# Patient Record
Sex: Male | Born: 1975
Health system: Southern US, Community
[De-identification: ages and names within clinical notes are randomized; demographics above are authoritative.]

## PROBLEM LIST (undated history)

## (undated) DIAGNOSIS — G43909 Migraine, unspecified, not intractable, without status migrainosus: Secondary | ICD-10-CM

## (undated) DIAGNOSIS — IMO0001 Reserved for inherently not codable concepts without codable children: Secondary | ICD-10-CM

## (undated) DIAGNOSIS — G4700 Insomnia, unspecified: Secondary | ICD-10-CM

## (undated) DIAGNOSIS — R51 Headache: Principal | ICD-10-CM

## (undated) HISTORY — DX: Reserved for inherently not codable concepts without codable children: IMO0001

## (undated) HISTORY — DX: Migraine, unspecified, not intractable, without status migrainosus: G43.909

## (undated) HISTORY — DX: Insomnia, unspecified: G47.00

## (undated) HISTORY — DX: Headache: R51

---

## 2005-06-28 ENCOUNTER — Emergency Department (HOSPITAL_COMMUNITY): Admission: EM | Admit: 2005-06-28 | Discharge: 2005-06-28 | Payer: Self-pay | Admitting: Emergency Medicine

## 2011-02-27 ENCOUNTER — Emergency Department (HOSPITAL_COMMUNITY)
Admission: EM | Admit: 2011-02-27 | Discharge: 2011-02-28 | Disposition: A | Discharge status: Home / Self Care | Payer: Managed Care, Other (non HMO) | Attending: Emergency Medicine | Admitting: Emergency Medicine

## 2011-02-27 DIAGNOSIS — R109 Unspecified abdominal pain: Secondary | ICD-10-CM | POA: Insufficient documentation

## 2011-02-27 DIAGNOSIS — R10819 Abdominal tenderness, unspecified site: Secondary | ICD-10-CM | POA: Insufficient documentation

## 2011-02-27 DIAGNOSIS — Z9889 Other specified postprocedural states: Secondary | ICD-10-CM | POA: Insufficient documentation

## 2011-02-28 ENCOUNTER — Emergency Department (HOSPITAL_COMMUNITY): Payer: Managed Care, Other (non HMO)

## 2011-02-28 LAB — URINALYSIS, ROUTINE W REFLEX MICROSCOPIC
Bilirubin Urine: NEGATIVE
Glucose, UA: NEGATIVE mg/dL
Hgb urine dipstick: NEGATIVE
Leukocytes, UA: NEGATIVE
Nitrite: NEGATIVE
Specific Gravity, Urine: 1.036 — ABNORMAL HIGH (ref 1.005–1.030)
Urobilinogen, UA: 1 mg/dL (ref 0.0–1.0)

## 2011-02-28 LAB — CBC
HCT: 43 % (ref 39.0–52.0)
MCHC: 37.2 g/dL — ABNORMAL HIGH (ref 30.0–36.0)
MCV: 86.9 fL (ref 78.0–100.0)
Platelets: 345 10*3/uL (ref 150–400)
RDW: 11.9 % (ref 11.5–15.5)
WBC: 9.9 10*3/uL (ref 4.0–10.5)

## 2011-02-28 LAB — POCT I-STAT, CHEM 8
BUN: 20 mg/dL (ref 6–23)
Creatinine, Ser: 1.5 mg/dL — ABNORMAL HIGH (ref 0.50–1.35)
Hemoglobin: 16.3 g/dL (ref 13.0–17.0)
Potassium: 3.9 mEq/L (ref 3.5–5.1)
Sodium: 141 mEq/L (ref 135–145)
TCO2: 26 mmol/L (ref 0–100)

## 2011-02-28 LAB — DIFFERENTIAL
Eosinophils Absolute: 0 10*3/uL (ref 0.0–0.7)
Lymphocytes Relative: 24 % (ref 12–46)
Lymphs Abs: 2.4 10*3/uL (ref 0.7–4.0)
Monocytes Relative: 9 % (ref 3–12)

## 2011-02-28 LAB — HEPATIC FUNCTION PANEL
Bilirubin, Direct: 0.1 mg/dL (ref 0.0–0.3)
Indirect Bilirubin: 0.7 mg/dL (ref 0.3–0.9)

## 2011-02-28 MED ORDER — IOHEXOL 300 MG/ML  SOLN
80.0000 mL | Freq: Once | INTRAMUSCULAR | Status: AC | PRN
  Administered 2011-02-28: 80 mL via INTRAVENOUS

## 2012-10-16 IMAGING — CT CT ABD-PELV W/ CM
3 of 4 series · 14 of 32 positions shown, 19 images · IV contrast (80ml omni 300)
Comparison: 06/28/2005

CLINICAL DATA: Right-sided abdominal pain.

CT ABDOMEN AND PELVIS WITH CONTRAST
TECHNIQUE: Multidetector CT imaging of the abdomen and pelvis was
performed following the standard protocol during bolus
administration of intravenous contrast.
Contrast: 80mL OMNIPAQUE IOHEXOL 300 MG/ML IV SOLN

[Series 2: routine abdomen · axial · 0.78mm/px · z∈[-457,-167]mm · 4 of 96 slices shown, 9 images]
[im 20/96  soft-tissue]
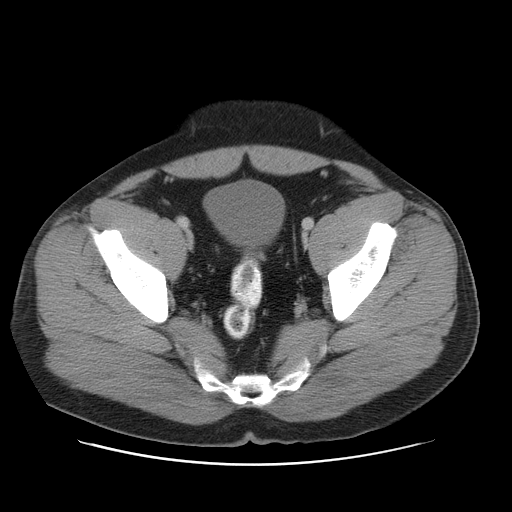
[im 20/96  lung]
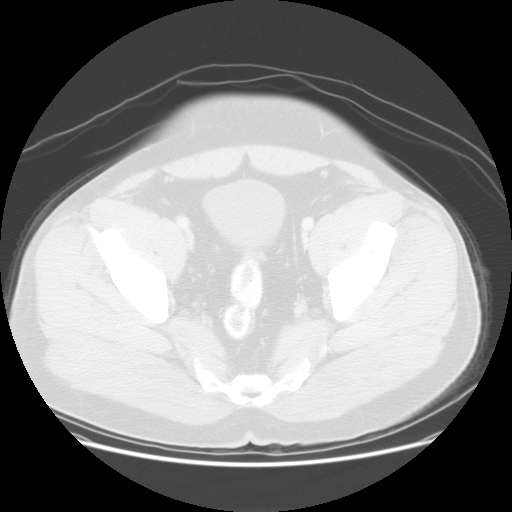
[im 20/96  bone]
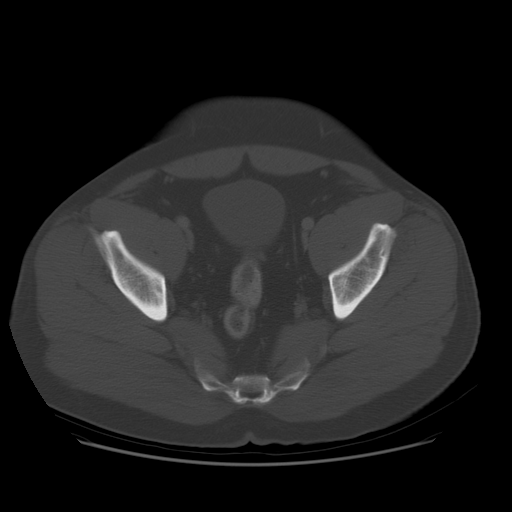
[im 39/96  soft-tissue]
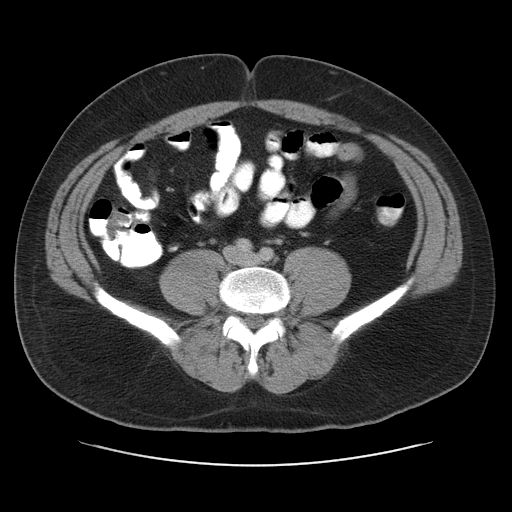
[im 39/96  lung]
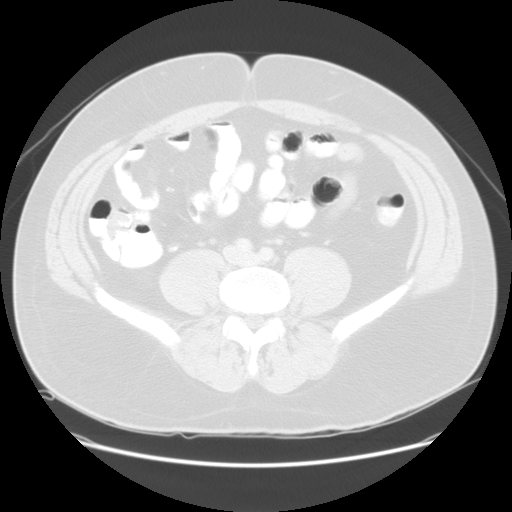
[im 58/96  soft-tissue]
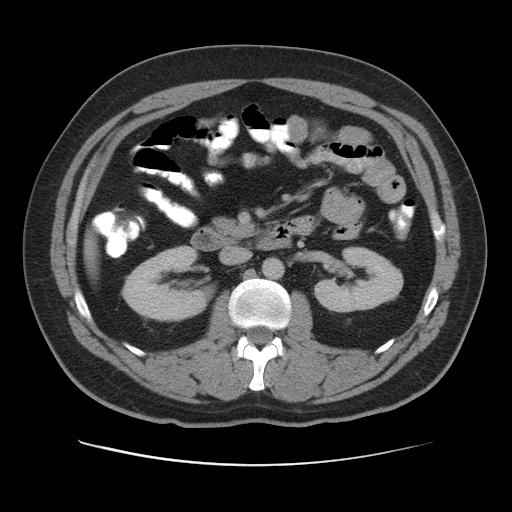
[im 58/96  lung]
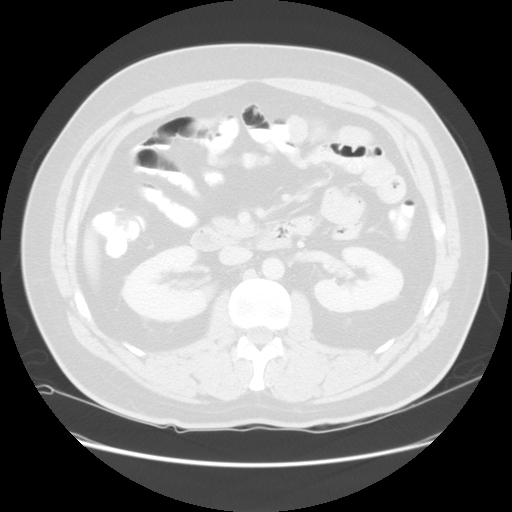
[im 77/96  soft-tissue]
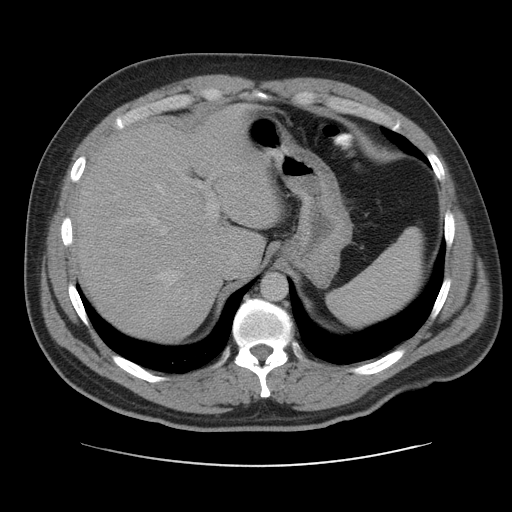
[im 77/96  lung]
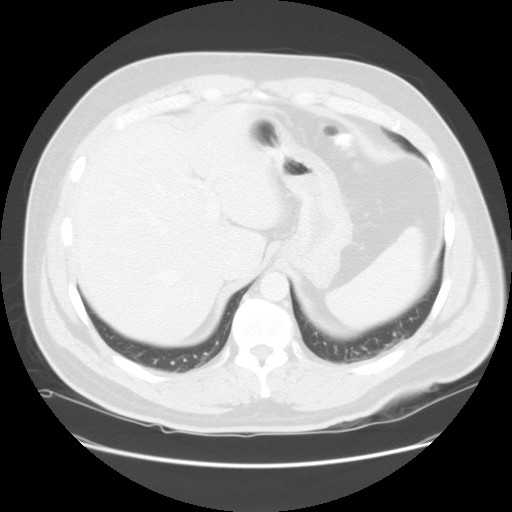

[Series 400: cor · coronal · 0.96mm/px · 2 of 161 slices shown]
[im 17/161  soft-tissue]
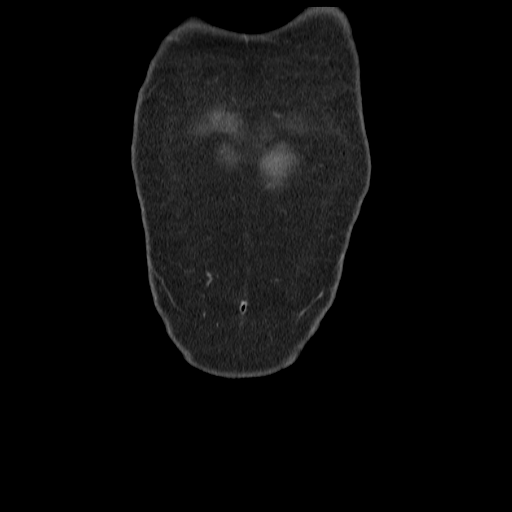
[im 33/161  soft-tissue]
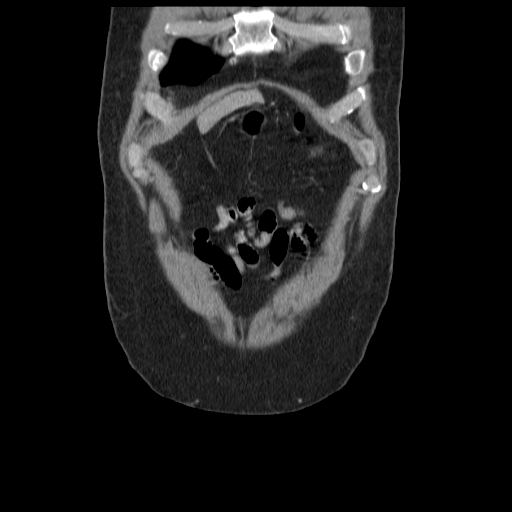

[Series 401: sag · sagittal · 0.96mm/px · 8 of 191 slices shown]
[im 16/191  soft-tissue]
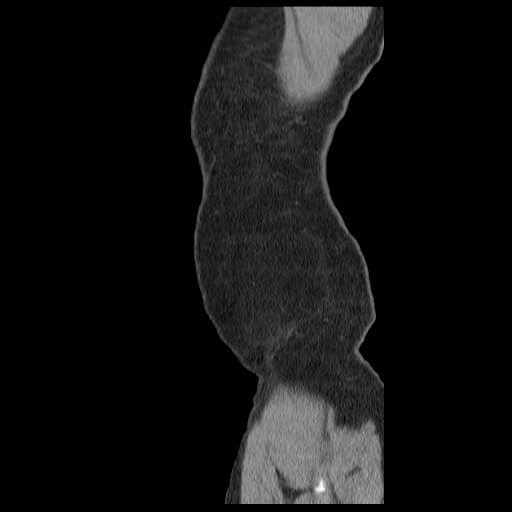
[im 48/191  soft-tissue]
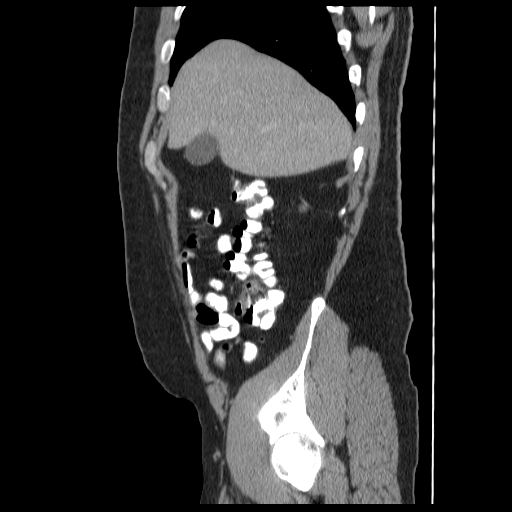
[im 64/191  soft-tissue]
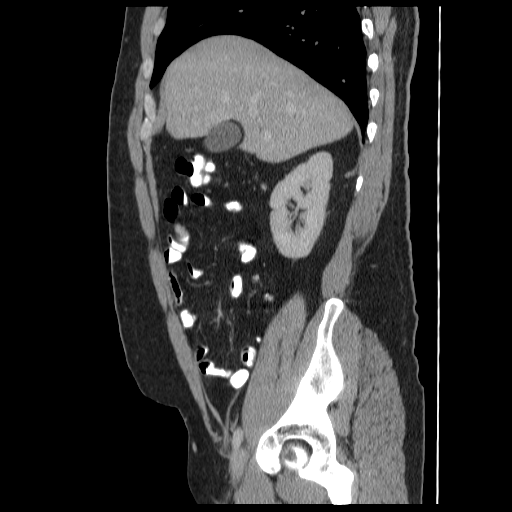
[im 80/191  soft-tissue]
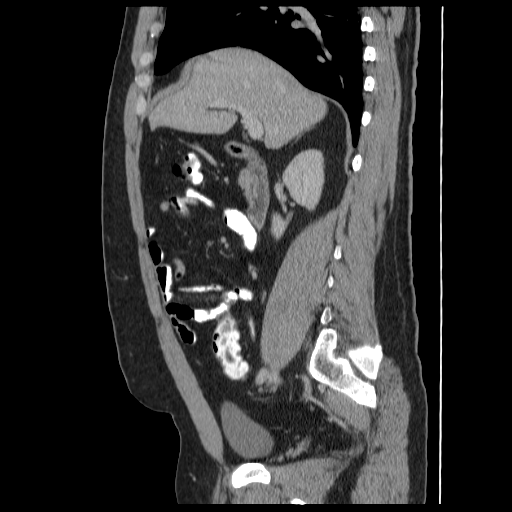
[im 111/191  soft-tissue]
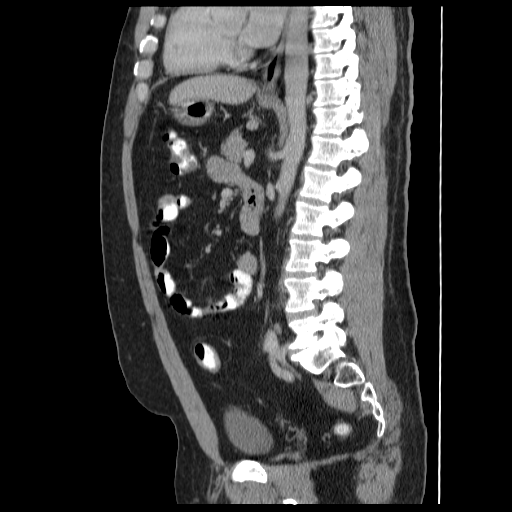
[im 127/191  soft-tissue]
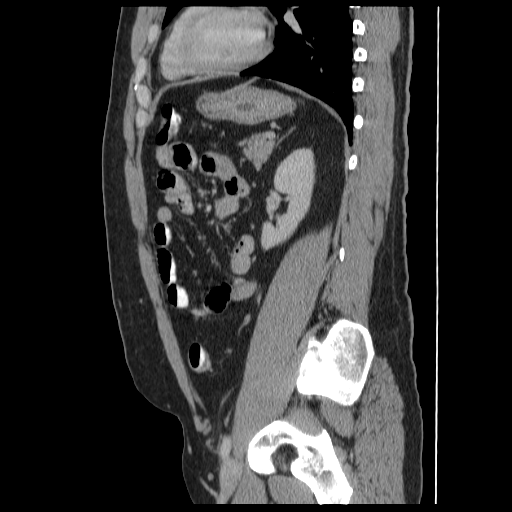
[im 143/191  soft-tissue]
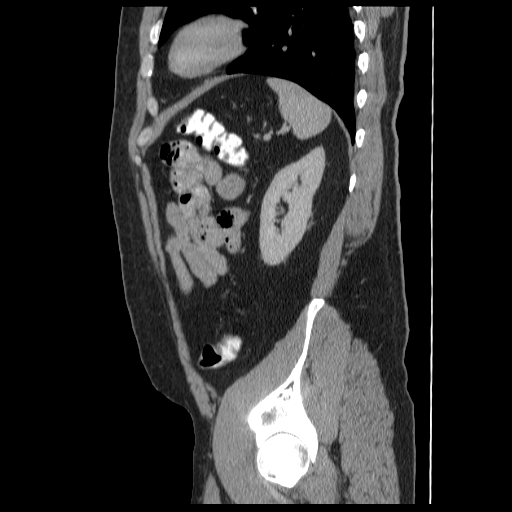
[im 175/191  soft-tissue]
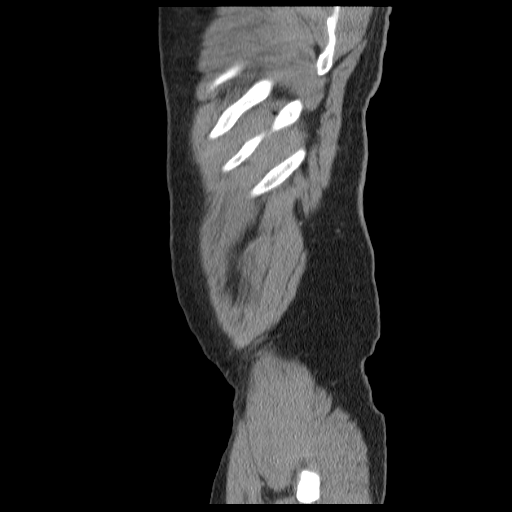

[14 of 32 positions shown; findings below may reference images not displayed]

FINDINGS: Limited images through the lung bases demonstrate no
significant appreciable abnormality. The heart size is within
normal limits. No pleural or pericardial effusion.

Low attenuation of the liver suggests fatty infiltration.
Unremarkable biliary system, spleen, pancreas, adrenal glands.
Bilateral nonobstructing renal stones.  No hydronephrosis or
hydroureter.

No bowel obstruction.  No CT evidence for colitis.  Normal
appendix.  Normal appearing terminal ileum.  No free
intraperitoneal air or fluid.  No lymphadenopathy.

There is scattered atherosclerotic calcification of the aorta and
its branches. No aneurysmal dilatation.

Thin-walled bladder.  Prostatic calcifications.

L5 S1 degenerative disc disease.  No aggressive osseous
abnormality.
IMPRESSION: Bilateral nonobstructing renal stones.  No hydronephrosis or
hydroureter.

Normal appendix.

## 2013-04-21 ENCOUNTER — Encounter: Payer: Self-pay | Admitting: Neurology

## 2013-04-22 ENCOUNTER — Encounter: Payer: Self-pay | Admitting: Neurology

## 2013-04-22 ENCOUNTER — Encounter (INDEPENDENT_AMBULATORY_CARE_PROVIDER_SITE_OTHER): Payer: Self-pay

## 2013-04-22 ENCOUNTER — Ambulatory Visit (INDEPENDENT_AMBULATORY_CARE_PROVIDER_SITE_OTHER): Payer: BC Managed Care – PPO | Admitting: Neurology

## 2013-04-22 VITALS — BP 131/91 | HR 72 | Temp 97.9°F | Ht 72.0 in | Wt 232.0 lb

## 2013-04-22 DIAGNOSIS — R0683 Snoring: Secondary | ICD-10-CM

## 2013-04-22 DIAGNOSIS — R0609 Other forms of dyspnea: Secondary | ICD-10-CM

## 2013-04-22 DIAGNOSIS — R51 Headache: Secondary | ICD-10-CM

## 2013-04-22 DIAGNOSIS — R519 Headache, unspecified: Secondary | ICD-10-CM

## 2013-04-22 DIAGNOSIS — I10 Essential (primary) hypertension: Secondary | ICD-10-CM

## 2013-04-22 HISTORY — DX: Headache, unspecified: R51.9

## 2013-04-22 MED ORDER — PROPRANOLOL HCL 20 MG PO TABS: ORAL_TABLET | ORAL | Status: DC

## 2013-04-22 NOTE — Patient Instructions (Addendum)
Please remember, common headache triggers are: sleep deprivation, dehydration, overheating, stress, hypoglycemia or skipping meals and blood sugar fluctuations, excessive pain medications or excessive alcohol use or caffeine withdrawal. Some people have food triggers such as aged cheese, orange juice or chocolate, especially dark chocolate, or MSG (monosodium glutamate). Try to avoid these headache triggers as much possible. It may be helpful to keep a headache diary to figure out what makes your headaches worse or brings them on and what alleviates them. Some people report headache onset after exercise but studies have shown that regular exercise may actually prevent headaches from coming. If you have exercise-induced headaches, please make sure that you drink plenty of fluid before and after exercising and that you do not over do it and do not overheat.  I think overall you are doing fairly well but I do want to suggest a few things today:  Remember to drink plenty of fluid, eat healthy meals and do not skip any meals. Try to eat protein with a every meal and eat a healthy snack such as fruit or nuts in between meals. Try to keep a regular sleep-wake schedule and try to exercise daily, particularly in the form of walking, 20-30 minutes a day, if you can.   As far as your medications are concerned, I would like to suggest a trial of Inderal (generic name: propranolol) 20 mg strength: take 1 pill each bedtime for 1 week, then 1 pill twice daily for 2 weeks, then 1 pill 3 times a day thereafter. Common side effect reported are: lethargy, sedation, low blood pressure and low pulse rate. Please monitor your BP and Pulse every few days, if your pulse drops lower than 55 you may feel bad and we may have to adjust your dose. Same with your BP below 110/55.   As far as diagnostic testing: sleep study and MRI brain wo contrast.  I would like to see you back in 3 months, sooner if we need to. Please call us with  any interim questions, concerns, problems, updates or refill requests.  Please also call us for any test results so we can go over those with you on the phone. Brett Canales is my clinical assistant and will answer any of your questions and relay your messages to me and also relay most of my messages to you.  Our phone number is [phone]. We also have an after hours call service for urgent matters and there is a physician on-call for urgent questions. For any emergencies you know to call 911 or go to the nearest emergency room.

## 2013-04-22 NOTE — Progress Notes (Addendum)
Subjective:    Patient ID: Dean Horn is a 37 y.o. male.  HPI    Huston Foley, MD, PhD [facility] [address] [address] [place], [place] [address]  Dear Dr. Hyacinth Meeker,  I saw your patient, Dean Horn, upon your kind request in my neurologic clinic today for initial consultation of his headache, in particular concern for migraines. The patient is accompanied by his wife today. As you know, Dean Horn is a very pleasant 37 year old right-handed gentleman with an otherwise benign medical history, who has had recurrent headaches for years. His headaches are described as a throbbing sensation around his eyes and in the back of his head and moderate in degree. They do occur every 3 days approximately. He has had associated nausea and sometimes vomiting, photophobia and sensitivity to noise. He has noted stress and exercise his triggers and relieving factors include going to sleep in a dark room. He has seen you and you placed him on amitriptyline and gave him samples of Zomig. Zomig has been helpful. He takes about 7 pills each month. He took one pill of amitriptyline but did not like how he felt on it so he never took more.  He reports a bilateral headache, which is described as constant ache, and stabbing in addition to throbbing. There is no associated nausea and vomiting, and there is no photophobia and sonophobia. He denies unilateral nasal congestion or tearing of his eye. Sometimes his eyes get red because he rubs them. Of note, he snores and sometimes this is loud. He is a light sleeper and restless sleeper. He often wakes up with a headache according to his wife. He describes the headache often that starts in the back of his head and his neck area and radiates up. It is rarely unilateral only. He has had issues with kidney stones. He had a bump in his creatinine level a couple of years ago because he was passing a kidney stone. He was told that he had 2 or 3  more small stones that do not need further attention with any procedure. He was told that he should avoid certain medications. He is not sure what his blood pressure is like typically. He does not go to the doctor very often. He has made some choking sounds while asleep according to his wife. He denies frank daytime somnolence. He works about 10 hours a day and works out 1 hour first thing in the morning. There is no family history of migraine headaches and there is no family history of stroke or sleep apnea. Never had TIA or stroke symptoms, denying sudden onset of one sided weakness, numbness, tingling, slurring of speech or droopy face, hearing loss, tinnitus, diplopia or visual field cut or monocular loss of vision.  His Past Medical History Is Significant For: Past Medical History  Diagnosis Date  . Migraine, unspecified, without mention of intractable migraine without mention of status migrainosus   . Insomnia, unspecified   . Myalgia and myositis, unspecified   . Recurrent headache 04/22/2013    His Past Surgical History Is Significant For: History reviewed. No pertinent past surgical history.  His Family History Is Significant For: Family History  Problem Relation Age of Onset  . Diabetes Mother   . Hypertension Mother     His Social History Is Significant For: History   Social History  . Marital Status: Married    Spouse Name: N/A    Number of Children: N/A  .  Years of Education: N/A   Social History Main Topics  . Smoking status: Never Smoker   . Smokeless tobacco: None  . Alcohol Use: No  . Drug Use: No  . Sexual Activity: None   Other Topics Concern  . None   Social History Narrative  . None    His Allergies Are:  No Known Allergies:   His Current Medications Are:  Outpatient Encounter Prescriptions as of 04/22/2013  Medication Sig  . zolmitriptan (ZOMIG-ZMT) 5 MG disintegrating tablet Take 1 tablet by mouth as needed.  Marland Kitchen LORazepam (ATIVAN) 2 MG tablet  Take 1 tablet by mouth as needed.  . propranolol (INDERAL) 20 MG tablet 1 pill each bedtime for 1 week, then 1 pill twice daily for 2 weeks, then 1 pill 3 times a day thereafter.  . [DISCONTINUED] amitriptyline (ELAVIL) 25 MG tablet Take 25 mg by mouth at bedtime.  :  Review of Systems:  Out of a complete 14 point review of systems, all are reviewed and negative with the exception of these symptoms as listed below:  Review of Systems  Constitutional: Negative.   Eyes: Negative.   Respiratory: Negative.   Cardiovascular: Negative.   Gastrointestinal: Negative.   Endocrine: Negative.   Genitourinary: Negative.   Musculoskeletal: Negative.   Skin: Negative.   Allergic/Immunologic: Negative.   Neurological: Positive for headaches (migraine).  Hematological: Negative.   Psychiatric/Behavioral: Positive for sleep disturbance.    Objective:  Neurologic Exam  Physical Exam Physical Examination:   Filed Vitals:   04/22/13 0818  BP: 131/91  Pulse: 72  Temp: 97.9 F (36.6 C)   General Examination: The patient is a very pleasant 37 y.o. male in no acute distress. He appears well-developed and well-nourished and adequately groomed.   HEENT: Normocephalic, atraumatic, pupils are equal, round and reactive to light and accommodation. Funduscopic exam is normal with sharp disc margins noted. Extraocular tracking is good without limitation to gaze excursion or nystagmus noted. Normal smooth pursuit is noted. Hearing is grossly intact. Tympanic membranes are clear bilaterally. Face is symmetric with normal facial animation and normal facial sensation. Speech is clear with no dysarthria noted. There is no hypophonia. There is no lip, neck/head, jaw or voice tremor. Neck is supple with full range of passive and active motion. There are no carotid bruits on auscultation. Oropharynx exam reveals: mild mouth dryness, adequate dental hygiene and mild airway crowding, due to redundant soft palate and  mildly elongated uvula. Mallampati is class I. Tongue protrudes centrally and palate elevates symmetrically. Tonsils are 1+ in size/absent. Neck size is 17 inches.   Chest: Clear to auscultation without wheezing, rhonchi or crackles noted.  Heart: S1+S2+0, regular and normal without murmurs, rubs or gallops noted.   Abdomen: Soft, non-tender and non-distended with normal bowel sounds appreciated on auscultation.  Extremities: There is no pitting edema in the distal lower extremities bilaterally. Pedal pulses are intact.  Skin: Warm and dry without trophic changes noted. There are no varicose veins.  Musculoskeletal: exam reveals no obvious joint deformities, tenderness or joint swelling or erythema.   Neurologically:  Mental status: The patient is awake, alert and oriented in all 4 spheres. His memory, attention, language and knowledge are appropriate. There is no aphasia, agnosia, apraxia or anomia. Speech is clear with normal prosody and enunciation. Thought process is linear. Mood is congruent and affect is normal.  Cranial nerves are as described above under HEENT exam. In addition, shoulder shrug is normal with equal shoulder height noted.  Motor exam: Normal bulk, strength and tone is noted in UEs and LEs. There is no drift, tremor or rebound. Romberg is negative. Reflexes are 2+ throughout. Toes are downgoing bilaterally. Fine motor skills are intact with normal finger taps, normal hand movements, normal rapid alternating patting, normal foot taps and normal foot agility.  Cerebellar testing shows no dysmetria or intention tremor on finger to nose testing. Heel to shin is unremarkable bilaterally. There is no truncal or gait ataxia.  Sensory exam is intact to light touch, pinprick, vibration, temperature sense and proprioception in the upper and lower extremities.  Gait, station and balance are unremarkable. No veering to one side is noted. No leaning to one side is noted. Posture is  age-appropriate and stance is narrow based. No problems turning are noted. He turns en bloc. Tandem walk is unremarkable. Intact toe and heel stance is noted.               Assessment and Plan:   In summary, Dean Horn is a very pleasant 37 y.o.-year old male with an underlying fairly benign medical history, who presents with recurrent HAs. His history and physical exam are not quite typical for migraine headaches even now he does improve with stomach. His history does not suggest cluster headaches. He does have some elevated blood pressure value today and is not sure if he has normal or high blood pressure values at baseline. He may have a combination headache including tension type headache, some migrainous headache, cervicogenic headache and headache do to underlying sleep apnea. His history and physical exam are somewhat concerning for obstructive sleep apnea as he does snore, often wakes up with a headache, and has a mildly enlarged next size. He also is overweight. I talked to him and his wife at length about his symptoms, my findings and his history in detail today. I suggested that we proceed with further testing in the form of brain MRI and sleep study. I reassured him that his exam is nonfocal. For symptomatic treatment I suggested that he continue with Zomig as needed. We talked about prophylactic treatment. Given his kidney stone history of would avoid Topamax in his case. He did not like amitriptyline. I will avoid gabapentin at this time as he wants to not take anything that makes him too sleepy. He has a little baby and wants to be able to wake up to tend to her. I suggested a trial of Inderal starting with 20 mg once daily with weekly increments will increase to up to 20 mg 3 times a day. I talked to him about side effects including drop in blood pressure, lightheadedness, lethargy, drop in pulse rate. I have advised him to keep a blood pressure log. I would like to see him back after his MRI  and his sleep study are completed. We will also call him with the test results to keep them updated. He is advised to call with any interim questions, concerns, problems or updates or refill requests. I will routinely see him back in 3 months, sooner if the need arises. I answered all his questions today and he and his wife were in agreement. I talked to him about common headache triggers: sleep deprivation, dehydration, overheating, stress, hypoglycemia or skipping meals and blood sugar fluctuations, excessive pain medications or excessive alcohol use or caffeine withdrawal. Some people have food triggers such as aged cheese, orange juice or chocolate, especially dark chocolate, or MSG (monosodium glutamate). He is to try to avoid  these headache triggers as much possible. It may be helpful to keep a headache diary to figure out what makes His headaches worse or brings them on and what alleviates them. Some people report headache onset after exercise but studies have shown that regular exercise may actually prevent headaches from coming. If He has exercise-induced headaches, He is advised to drink plenty of fluid before and after exercising and that to not overdo it and to not overheat. Thank you very much for allowing me to participate in the care of this nice patient. If I can be of any further assistance to you please do not hesitate to call me at [phone].  Sincerely,   Huston Foley, MD, PhD

## 2013-04-30 ENCOUNTER — Telehealth: Payer: Self-pay | Admitting: Neurology

## 2013-04-30 MED ORDER — ZOLMITRIPTAN 5 MG PO TBDP: 5.0000 mg | ORAL_TABLET | ORAL | Status: DC | PRN

## 2013-04-30 NOTE — Telephone Encounter (Signed)
Patient's [facility] pharmacy called stating that they haven't heard back about patient's Zomig refill request.

## 2013-04-30 NOTE — Telephone Encounter (Signed)
This was the first request we have received for this medication.  Rx has been sent.

## 2013-05-27 ENCOUNTER — Ambulatory Visit (INDEPENDENT_AMBULATORY_CARE_PROVIDER_SITE_OTHER): Payer: BC Managed Care – PPO

## 2013-05-27 DIAGNOSIS — R519 Headache, unspecified: Secondary | ICD-10-CM

## 2013-05-27 DIAGNOSIS — R51 Headache: Secondary | ICD-10-CM

## 2013-05-27 DIAGNOSIS — R0683 Snoring: Secondary | ICD-10-CM

## 2013-05-27 DIAGNOSIS — I10 Essential (primary) hypertension: Secondary | ICD-10-CM

## 2013-05-31 ENCOUNTER — Telehealth: Payer: Self-pay | Admitting: Neurology

## 2013-05-31 NOTE — Progress Notes (Signed)
Quick Note:  Please advise patient that his brain MRI without contrast showed normal findings as far as the brain structure and fluid spaces are concerned. However, there are findings consistent with chronic and severe sinus disease. While there is no acute sinusitis. He may have chronic sinus issues that may contribute to headaches. If he has not seen an ear nose throat physician. It may be wise to see an ENT doctor. Please advise patient and asked him if he wants a referral for ENT.  Huston FoleySaima Melvine Julin, MD, PhD [facility] ([facility]  ______

## 2013-05-31 NOTE — Telephone Encounter (Signed)
Patient's wife calling requesting results of MRI - Can also leave msg on wife's phone [phone] regarding results

## 2013-06-02 NOTE — Telephone Encounter (Signed)
Message copied by [facility], Zarian Colpitts on Wed Jun 02, 2013 10:55 AM ------      Message from: Huston FoleyATHAR, SAIMA      Created: Mon May 31, 2013  8:57 AM       Please advise patient that his brain MRI without contrast showed normal findings as far as the brain structure and fluid spaces are concerned. However, there are findings consistent with chronic and severe sinus disease. While there is no acute sinusitis. He may have chronic sinus issues that may contribute to headaches. If he has not seen an ear nose throat physician. It may be wise to see an ENT doctor. Please advise patient and asked him if he wants a referral for ENT.       Huston FoleySaima Athar, MD, PhD      [facility]       ------

## 2013-06-02 NOTE — Telephone Encounter (Signed)
I called and left VM at the below indicated phone number. I reviewed findings and asked that we receive a return call if an ENT specialist referral is needed.

## 2013-06-07 ENCOUNTER — Emergency Department (HOSPITAL_BASED_OUTPATIENT_CLINIC_OR_DEPARTMENT_OTHER): Payer: BC Managed Care – PPO

## 2013-06-07 ENCOUNTER — Encounter (HOSPITAL_BASED_OUTPATIENT_CLINIC_OR_DEPARTMENT_OTHER): Payer: Self-pay | Admitting: Emergency Medicine

## 2013-06-07 ENCOUNTER — Emergency Department (HOSPITAL_BASED_OUTPATIENT_CLINIC_OR_DEPARTMENT_OTHER)
Admission: EM | Admit: 2013-06-07 | Discharge: 2013-06-08 | Disposition: A | Discharge status: Home / Self Care | Payer: BC Managed Care – PPO | Attending: Emergency Medicine | Admitting: Emergency Medicine

## 2013-06-07 DIAGNOSIS — Z79899 Other long term (current) drug therapy: Secondary | ICD-10-CM | POA: Insufficient documentation

## 2013-06-07 DIAGNOSIS — N201 Calculus of ureter: Secondary | ICD-10-CM | POA: Insufficient documentation

## 2013-06-07 DIAGNOSIS — G4700 Insomnia, unspecified: Secondary | ICD-10-CM | POA: Insufficient documentation

## 2013-06-07 DIAGNOSIS — G43909 Migraine, unspecified, not intractable, without status migrainosus: Secondary | ICD-10-CM | POA: Insufficient documentation

## 2013-06-07 DIAGNOSIS — N23 Unspecified renal colic: Secondary | ICD-10-CM

## 2013-06-07 LAB — URINE MICROSCOPIC-ADD ON

## 2013-06-07 LAB — URINALYSIS, ROUTINE W REFLEX MICROSCOPIC
BILIRUBIN URINE: NEGATIVE
GLUCOSE, UA: NEGATIVE mg/dL
KETONES UR: NEGATIVE mg/dL
Leukocytes, UA: NEGATIVE
Nitrite: NEGATIVE
PROTEIN: 30 mg/dL — AB
Specific Gravity, Urine: 1.037 — ABNORMAL HIGH (ref 1.005–1.030)
UROBILINOGEN UA: 0.2 mg/dL (ref 0.0–1.0)
pH: 6 (ref 5.0–8.0)

## 2013-06-07 MED ORDER — TAMSULOSIN HCL 0.4 MG PO CAPS
0.4000 mg | ORAL_CAPSULE | Freq: Once | ORAL | Status: AC
  Administered 2013-06-07: 0.4 mg via ORAL
  Filled 2013-06-07: qty 1

## 2013-06-07 MED ORDER — KETOROLAC TROMETHAMINE 30 MG/ML IJ SOLN
30.0000 mg | Freq: Once | INTRAMUSCULAR | Status: AC
  Administered 2013-06-07: 30 mg via INTRAVENOUS
  Filled 2013-06-07: qty 1

## 2013-06-07 MED ORDER — SODIUM CHLORIDE 0.9 % IV SOLN
INTRAVENOUS | Status: DC
Start: 2013-06-07 — End: 2013-06-08
  Administered 2013-06-07: via INTRAVENOUS

## 2013-06-07 NOTE — ED Notes (Addendum)
Back pain. Hematuria yesterday. States he feels like he has a kidney stone. Hx of stone 7 years ago. He took a left over Vicodin for pain this am.

## 2013-06-07 NOTE — ED Provider Notes (Signed)
CSN: [id number]     Arrival date & time 06/07/13  2007 History  This chart was scribed for Hanley Seamen, MD by Dorothey Baseman, ED Scribe. This patient was seen in room MH02/MH02 and the patient's care was started at 10:47 PM.    Chief Complaint  Patient presents with  . Back Pain   The history is provided by the patient. No language interpreter was used.   HPI Comments: Dean Horn is a 38 y.o. male with a history of kidney stones who presents to the Emergency Department complaining of an intermittent pain to the right flank onset 1.5 days ago that he states feels similar to his past kidney stones. He reports some associated hematuria, nausea, vomiting, and chills onset yesterday. He reports taking hydrocodone at home with mild, temporary relief of his pain. Patient has no other pertinent medical history.   Past Medical History  Diagnosis Date  . Migraine, unspecified, without mention of intractable migraine without mention of status migrainosus   . Insomnia, unspecified   . Myalgia and myositis, unspecified   . Recurrent headache 04/22/2013   History reviewed. No pertinent past surgical history. Family History  Problem Relation Age of Onset  . Diabetes Mother   . Hypertension Mother    History  Substance Use Topics  . Smoking status: Never Smoker   . Smokeless tobacco: Not on file  . Alcohol Use: No    Review of Systems  A complete 10 system review of systems was obtained and all systems are negative except as noted in the HPI and PMH.   Allergies  Review of patient's allergies indicates no known allergies.  Home Medications   Current Outpatient Rx  Name  Route  Sig  Dispense  Refill  . LORazepam (ATIVAN) 2 MG tablet   Oral   Take 1 tablet by mouth as needed.         . propranolol (INDERAL) 20 MG tablet      1 pill each bedtime for 1 week, then 1 pill twice daily for 2 weeks, then 1 pill 3 times a day thereafter.   90 tablet   5   . zolmitriptan (ZOMIG-ZMT) 5 MG  disintegrating tablet   Oral   Take 1 tablet (5 mg total) by mouth as needed.   8 tablet   3    Triage Vitals: BP 124/93  Pulse 87  Temp(Src) 98.9 F (37.2 C) (Oral)  Resp 20  Ht 6' (1.829 m)  Wt 232 lb (105.235 kg)  BMI 31.46 kg/m2  SpO2 100%  Physical Exam  Nursing note and vitals reviewed. Constitutional: He is oriented to person, place, and time. He appears well-developed and well-nourished. No distress.  HENT:  Head: Normocephalic and atraumatic.  Eyes: Conjunctivae and EOM are normal. Pupils are equal, round, and reactive to light.  Neck: Normal range of motion. Neck supple.  Cardiovascular: Normal rate, regular rhythm and normal heart sounds.   Pulmonary/Chest: Effort normal and breath sounds normal. No respiratory distress.  Abdominal: He exhibits no distension.  Genitourinary:  Mild right CVA tenderness.   Musculoskeletal: Normal range of motion.  Neurological: He is alert and oriented to person, place, and time.  Skin: Skin is warm and dry.  Psychiatric: He has a normal mood and affect. His behavior is normal.    ED Course  Procedures (including critical care time)  DIAGNOSTIC STUDIES: Oxygen Saturation is 100% on room air, normal by my interpretation.    COORDINATION OF CARE:  10:50 PM- Discussed that UA is positive for hematuria. Will order a CT of the abdomen. Will order IV fluids and Toradol to manage symptoms. Discussed treatment plan with patient at bedside and patient verbalized agreement.   11:37 PM- Discussed that CT results indicate a large kidney stone. Will discharge patient with Flomax. Advised patient to follow up with the referred urologist. Discussed treatment plan with patient at bedside and patient verbalized agreement.     Results for orders placed during the hospital encounter of 06/07/13  URINALYSIS, ROUTINE W REFLEX MICROSCOPIC      Result Value Range   Color, Urine YELLOW  YELLOW   APPearance CLEAR  CLEAR   Specific Gravity, Urine  1.037 (*) 1.005 - 1.030   pH 6.0  5.0 - 8.0   Glucose, UA NEGATIVE  NEGATIVE mg/dL   Hgb urine dipstick MODERATE (*) NEGATIVE   Bilirubin Urine NEGATIVE  NEGATIVE   Ketones, ur NEGATIVE  NEGATIVE mg/dL   Protein, ur 30 (*) NEGATIVE mg/dL   Urobilinogen, UA 0.2  0.0 - 1.0 mg/dL   Nitrite NEGATIVE  NEGATIVE   Leukocytes, UA NEGATIVE  NEGATIVE  URINE MICROSCOPIC-ADD ON      Result Value Range   Squamous Epithelial / LPF RARE  RARE   RBC / HPF 7-10  <3 RBC/hpf   Bacteria, UA RARE  RARE   Ct Abdomen Pelvis Wo Contrast  06/08/2013   CLINICAL DATA:  Right-sided flank pain with nausea and vomiting for 2 days. Chills and hematuria.  EXAM: CT ABDOMEN AND PELVIS WITHOUT CONTRAST  TECHNIQUE: Multidetector CT imaging of the abdomen and pelvis was performed following the standard protocol without intravenous contrast.  COMPARISON:  DG ABDOMEN 1V dated 04/17/2011; CT ABD/PELVIS W CM dated 02/28/2011  FINDINGS: Lower Chest: Clear lung bases. Normal heart size without pericardial or pleural effusion.  Abdomen/Pelvis: Mild hepatic steatosis. Normal infused appearance of the spleen, stomach, pancreas, gallbladder, biliary tract, adrenal glands.  Bilateral nephrolithiasis. Moderate right-sided hydroureteronephrosis which continues to the level of a right bladder base stone which measures 6 mm on image 91/series 2. Likely just exiting the ureterovesicular junction.  Mild but age advanced aortic atherosclerosis. No retroperitoneal or retrocrural adenopathy. Normal colon, appendix, and terminal ileum. Normal small bowel without abdominal ascites.  No pelvic adenopathy. Normal prostate, without significant free pelvic fluid.  Bones/Musculoskeletal: Degenerative disc disease at the lumbosacral junction.  IMPRESSION: 1. Moderate right-sided urinary tract obstruction secondary to a stone at or just exiting the right ureterovesicular junction. 2. Bilateral nephrolithiasis. 3. Mild hepatic steatosis. 4. Age advanced  atherosclerosis.   Electronically Signed   By: Jeronimo Greaves M.D.   On: 06/08/2013 00:16            MDM   Nursing notes and vitals signs, including pulse oximetry, reviewed.  Summary of this visit's results, reviewed by myself:  Labs:  Results for orders placed during the hospital encounter of 06/07/13 (from the past 24 hour(s))  URINALYSIS, ROUTINE W REFLEX MICROSCOPIC     Status: Abnormal   Collection Time    06/07/13  8:30 PM      Result Value Range   Color, Urine YELLOW  YELLOW   APPearance CLEAR  CLEAR   Specific Gravity, Urine 1.037 (*) 1.005 - 1.030   pH 6.0  5.0 - 8.0   Glucose, UA NEGATIVE  NEGATIVE mg/dL   Hgb urine dipstick MODERATE (*) NEGATIVE   Bilirubin Urine NEGATIVE  NEGATIVE   Ketones, ur NEGATIVE  NEGATIVE mg/dL  Protein, ur 30 (*) NEGATIVE mg/dL   Urobilinogen, UA 0.2  0.0 - 1.0 mg/dL   Nitrite NEGATIVE  NEGATIVE   Leukocytes, UA NEGATIVE  NEGATIVE  URINE MICROSCOPIC-ADD ON     Status: None   Collection Time    06/07/13  8:30 PM      Result Value Range   Squamous Epithelial / LPF RARE  RARE   RBC / HPF 7-10  <3 RBC/hpf   Bacteria, UA RARE  RARE    Imaging Studies: Ct Abdomen Pelvis Wo Contrast  06/08/2013   CLINICAL DATA:  Right-sided flank pain with nausea and vomiting for 2 days. Chills and hematuria.  EXAM: CT ABDOMEN AND PELVIS WITHOUT CONTRAST  TECHNIQUE: Multidetector CT imaging of the abdomen and pelvis was performed following the standard protocol without intravenous contrast.  COMPARISON:  DG ABDOMEN 1V dated 04/17/2011; CT ABD/PELVIS W CM dated 02/28/2011  FINDINGS: Lower Chest: Clear lung bases. Normal heart size without pericardial or pleural effusion.  Abdomen/Pelvis: Mild hepatic steatosis. Normal infused appearance of the spleen, stomach, pancreas, gallbladder, biliary tract, adrenal glands.  Bilateral nephrolithiasis. Moderate right-sided hydroureteronephrosis which continues to the level of a right bladder base stone which measures 6  mm on image 91/series 2. Likely just exiting the ureterovesicular junction.  Mild but age advanced aortic atherosclerosis. No retroperitoneal or retrocrural adenopathy. Normal colon, appendix, and terminal ileum. Normal small bowel without abdominal ascites.  No pelvic adenopathy. Normal prostate, without significant free pelvic fluid.  Bones/Musculoskeletal: Degenerative disc disease at the lumbosacral junction.  IMPRESSION: 1. Moderate right-sided urinary tract obstruction secondary to a stone at or just exiting the right ureterovesicular junction. 2. Bilateral nephrolithiasis. 3. Mild hepatic steatosis. 4. Age advanced atherosclerosis.   Electronically Signed   By: Jeronimo GreavesKyle  Talbot M.D.   On: 06/08/2013 00:16   12:24 AM Pain controlled with IV Toradol. Patient advised of CT findings. Stone may be ready to pass is large enough that it may require urologic assistance.   I personally performed the services described in this documentation, which was scribed in my presence.  The recorded information has been reviewed and is accurate.     Hanley SeamenJohn L Crosby Bevan, MD 06/08/13 [phone]

## 2013-06-08 ENCOUNTER — Other Ambulatory Visit: Payer: Self-pay | Admitting: Urology

## 2013-06-08 MED ORDER — HYDROMORPHONE HCL 4 MG PO TABS: 2.0000 mg | ORAL_TABLET | ORAL | Status: DC | PRN

## 2013-06-08 MED ORDER — FENTANYL CITRATE 0.05 MG/ML IJ SOLN
100.0000 ug | Freq: Once | INTRAMUSCULAR | Status: AC
  Administered 2013-06-08: 100 ug via INTRAVENOUS
  Filled 2013-06-08: qty 2

## 2013-06-08 MED ORDER — TAMSULOSIN HCL 0.4 MG PO CAPS: ORAL_CAPSULE | ORAL | Status: DC

## 2013-06-08 NOTE — Discharge Instructions (Signed)
Kidney Stones  Kidney stones (urolithiasis) are deposits that form inside your kidneys. The intense pain is caused by the stone moving through the urinary tract. When the stone moves, the ureter goes into spasm around the stone. The stone is usually passed in the urine.   CAUSES   · A disorder that makes certain neck glands produce too much parathyroid hormone (primary hyperparathyroidism).  · A buildup of uric acid crystals, similar to gout in your joints.  · Narrowing (stricture) of the ureter.  · A kidney obstruction present at birth (congenital obstruction).  · Previous surgery on the kidney or ureters.  · Numerous kidney infections.  SYMPTOMS   · Feeling sick to your stomach (nauseous).  · Throwing up (vomiting).  · Blood in the urine (hematuria).  · Pain that usually spreads (radiates) to the groin.  · Frequency or urgency of urination.  DIAGNOSIS   · Taking a history and physical exam.  · Blood or urine tests.  · CT scan.  · Occasionally, an examination of the inside of the urinary bladder (cystoscopy) is performed.  TREATMENT   · Observation.  · Increasing your fluid intake.  · Extracorporeal shock wave lithotripsy This is a noninvasive procedure that uses shock waves to break up kidney stones.  · Surgery may be needed if you have severe pain or persistent obstruction. There are various surgical procedures. Most of the procedures are performed with the use of small instruments. Only small incisions are needed to accommodate these instruments, so recovery time is minimized.  The size, location, and chemical composition are all important variables that will determine the proper choice of action for you. Talk to your health care provider to better understand your situation so that you will minimize the risk of injury to yourself and your kidney.   HOME CARE INSTRUCTIONS   · Drink enough water and fluids to keep your urine clear or pale yellow. This will help you to pass the stone or stone fragments.  · Strain  all urine through the provided strainer. Keep all particulate matter and stones for your health care provider to see. The stone causing the pain may be as small as a grain of salt. It is very important to use the strainer each and every time you pass your urine. The collection of your stone will allow your health care provider to analyze it and verify that a stone has actually passed. The stone analysis will often identify what you can do to reduce the incidence of recurrences.  · Only take over-the-counter or prescription medicines for pain, discomfort, or fever as directed by your health care provider.  · Make a follow-up appointment with your health care provider as directed.  · Get follow-up X-rays if required. The absence of pain does not always mean that the stone has passed. It may have only stopped moving. If the urine remains completely obstructed, it can cause loss of kidney function or even complete destruction of the kidney. It is your responsibility to make sure X-rays and follow-ups are completed. Ultrasounds of the kidney can show blockages and the status of the kidney. Ultrasounds are not associated with any radiation and can be performed easily in a matter of minutes.  SEEK MEDICAL CARE IF:  · You experience pain that is progressive and unresponsive to any pain medicine you have been prescribed.  SEEK IMMEDIATE MEDICAL CARE IF:   · Pain cannot be controlled with the prescribed medicine.  · You have a fever   or shaking chills.  · The severity or intensity of pain increases over 18 hours and is not relieved by pain medicine.  · You develop a new onset of abdominal pain.  · You feel faint or pass out.  · You are unable to urinate.  MAKE SURE YOU:   · Understand these instructions.  · Will watch your condition.  · Will get help right away if you are not doing well or get worse.  Document Released: 05/06/2005 Document Revised: 01/06/2013 Document Reviewed: 10/07/2012  [employer]® Patient Information ©2014  [employer].

## 2013-06-17 ENCOUNTER — Ambulatory Visit (HOSPITAL_COMMUNITY): Admission: RE | Admit: 2013-06-17 | Payer: BC Managed Care – PPO | Source: Ambulatory Visit | Admitting: Urology

## 2013-06-17 ENCOUNTER — Encounter (HOSPITAL_COMMUNITY): Admission: RE | Payer: Self-pay | Source: Ambulatory Visit

## 2013-06-17 SURGERY — LITHOTRIPSY, ESWL
Anesthesia: LOCAL | Laterality: Right

## 2013-09-02 ENCOUNTER — Ambulatory Visit: Payer: BC Managed Care – PPO | Admitting: Neurology

## 2013-12-16 ENCOUNTER — Other Ambulatory Visit: Payer: Self-pay | Admitting: Neurology

## 2013-12-16 ENCOUNTER — Telehealth: Payer: Self-pay | Admitting: Neurology

## 2013-12-16 NOTE — Telephone Encounter (Signed)
Spouse calling to check on ENT Referral.  Please return call anytime and leave message if not available.  Thanks

## 2013-12-16 NOTE — Telephone Encounter (Signed)
Called and left VM message for return call concerning ENT referral, patient has not been seen since 04/2013, see telephone note from 05/2013, which ENT referral was suggested by Dr Frances FurbishAthar

## 2013-12-20 NOTE — Telephone Encounter (Signed)
Spoke with patient's wife and she said that she will do a little more research , will call back  with ENT physician she would like to have  the referral sent

## 2014-08-09 ENCOUNTER — Telehealth: Payer: Self-pay | Admitting: Neurology

## 2014-08-09 NOTE — Telephone Encounter (Signed)
Left message for wife. Stated that we last saw Dean Horn in 04/2013 and we would not be able to make that referral for him. I suggested that he get referral from his PCP. I left call back number for any questions.

## 2014-08-09 NOTE — Telephone Encounter (Signed)
Patient's [facility].  Please call and advise.

## 2014-09-16 ENCOUNTER — Other Ambulatory Visit: Payer: Self-pay | Admitting: Neurology

## 2014-09-16 NOTE — Telephone Encounter (Signed)
Last seen in 2014 

## 2014-12-01 DIAGNOSIS — Z87442 Personal history of urinary calculi: Secondary | ICD-10-CM | POA: Insufficient documentation

## 2014-12-01 DIAGNOSIS — G43009 Migraine without aura, not intractable, without status migrainosus: Secondary | ICD-10-CM | POA: Insufficient documentation

## 2015-03-30 DIAGNOSIS — N433 Hydrocele, unspecified: Secondary | ICD-10-CM | POA: Insufficient documentation

## 2017-03-06 ENCOUNTER — Ambulatory Visit: Payer: Managed Care, Other (non HMO) | Admitting: Family Medicine

## 2017-03-13 ENCOUNTER — Ambulatory Visit (INDEPENDENT_AMBULATORY_CARE_PROVIDER_SITE_OTHER): Payer: BLUE CROSS/BLUE SHIELD | Admitting: Family Medicine

## 2017-03-13 ENCOUNTER — Encounter: Payer: Self-pay | Admitting: Family Medicine

## 2017-03-13 DIAGNOSIS — G43009 Migraine without aura, not intractable, without status migrainosus: Secondary | ICD-10-CM

## 2017-03-13 NOTE — Progress Notes (Signed)
New patient office visit note:  Impression and Recommendations:    1. Migraine without aura and without status migrainosus, not intractable      There are no diagnoses linked to this encounter.  No problem-specific Assessment & Plan notes found for this encounter.   The patient was counseled, risk factors were discussed, anticipatory guidance given.   New Prescriptions   No medications on file    No orders of the defined types were placed in this encounter.   Discontinued Medications   HYDROMORPHONE (DILAUDID) 4 MG TABLET    Take 0.5-1 tablets (2-4 mg total) by mouth every 4 (four) hours as needed (for pain).   LORAZEPAM (ATIVAN) 2 MG TABLET    Take 1 tablet by mouth as needed.   PROPRANOLOL (INDERAL) 20 MG TABLET    1 pill each bedtime for 1 week, then 1 pill twice daily for 2 weeks, then 1 pill 3 times a day thereafter.   TAMSULOSIN (FLOMAX) 0.4 MG CAPS CAPSULE    Take 1 capsule daily until stone passes.    Modified Medications   No medications on file    No orders of the defined types were placed in this encounter.    Gross side effects, risk and benefits, and alternatives of medications discussed with patient.  Patient is aware that all medications have potential side effects and we are unable to predict every side effect or drug-drug interaction that may occur.  Expresses verbal understanding and consents to current therapy plan and treatment regimen.  Return for Fasting bldwrk-near future;then OV w me 1 wk later.  Please see AVS handed out to patient at the end of our visit for further patient instructions/ counseling done pertaining to today's office visit.    Note: This document was prepared using Dragon voice recognition software and may include unintentional dictation errors.  ----------------------------------------------------------------------------------------------------------------------    Subjective:    Chief complaint:   Chief  Complaint  Patient presents with  . Establish Care     HPI: Dean Horn is a pleasant 41 y.o. male who presents to [facility] today to review their medical history with me and establish care.   I asked the patient to review their chronic problem list with me to ensure everything was updated and accurate.    All recent office visits with other providers, any medical records that patient brought in etc  - I reviewed today.     Also asked pt to get me medical records from [facility] providers/ specialists that they had seen within the past 3-5 years- if they are in private practice and/or do not work for a [employer], [facility], [facility], [facility] or [employer] owned practice.  Told them to call their specialists to clarify this if they are not sure.   Sula Rumple in [place]- [place]prior PCP but patient recently moved out here.  Last lipid panel was done 12 12/14/2015 with total cholesterol 171, triglyceride 188, HDL 32, VLDL 38, LDL 101.  TSH 1.78, serum creatinine 1.01, glucose 89 sodium 140 potassium 4.7 AST 29 ALT 40 alkaline phosphatase 75 total bilirubin 0.7.   Problem  Migraine Without Aura and Without Status Migrainosus, Not Intractable   Having to use Zomig - once every three months.        Wt Readings from Last 3 Encounters:  03/13/17 231 lb 11.2 oz (105.1 kg)  06/07/13 232 lb (105.2 kg)  04/22/13 232 lb (105.2 kg)  BP Readings from Last 3 Encounters:  03/13/17 120/87  06/07/13 124/93  04/22/13 (!) 131/91   Pulse Readings from Last 3 Encounters:  03/13/17 100  06/07/13 87  04/22/13 72   BMI Readings from Last 3 Encounters:  03/13/17 31.42 kg/m  06/07/13 31.46 kg/m  04/22/13 31.46 kg/m    Patient Care Team    Relationship Specialty Notifications Start End  Patient, No Pcp Per PCP - General General Practice  06/07/13     Patient Active Problem List   Diagnosis Date Noted  . Left hydrocele 03/30/2015  . History of  nephrolithiasis 12/01/2014  . Migraine without aura and without status migrainosus, not intractable 12/01/2014  . Recurrent headache 04/22/2013     Past Medical History:  Diagnosis Date  . Insomnia, unspecified   . Migraine, unspecified, without mention of intractable migraine without mention of status migrainosus   . Myalgia and myositis, unspecified   . Recurrent headache 04/22/2013     Past Medical History:  Diagnosis Date  . Insomnia, unspecified   . Migraine, unspecified, without mention of intractable migraine without mention of status migrainosus   . Myalgia and myositis, unspecified   . Recurrent headache 04/22/2013     History reviewed. No pertinent surgical history.   Family History  Problem Relation Age of Onset  . Diabetes Mother   . Hypertension Mother      History  Drug Use No     History  Alcohol Use No     History  Smoking Status  . Never Smoker  Smokeless Tobacco  . Never Used     Outpatient Encounter Prescriptions as of 03/13/2017  Medication Sig Note  . zolmitriptan (ZOMIG-ZMT) 5 MG disintegrating tablet DISSOLVE ONE TABLET BY MOUTH AS NEEDED   . [DISCONTINUED] HYDROmorphone (DILAUDID) 4 MG tablet Take 0.5-1 tablets (2-4 mg total) by mouth every 4 (four) hours as needed (for pain).   . [DISCONTINUED] LORazepam (ATIVAN) 2 MG tablet Take 1 tablet by mouth as needed. 04/22/2013: Received from: External Pharmacy Received Sig:   . [DISCONTINUED] propranolol (INDERAL) 20 MG tablet 1 pill each bedtime for 1 week, then 1 pill twice daily for 2 weeks, then 1 pill 3 times a day thereafter.   . [DISCONTINUED] tamsulosin (FLOMAX) 0.4 MG CAPS capsule Take 1 capsule daily until stone passes.    No facility-administered encounter medications on file as of 03/13/2017.     Allergies: Patient has no known allergies.   ROS   Objective:   Blood pressure 120/87, pulse 100, height 6' (1.829 m), weight 231 lb 11.2 oz (105.1 kg). Body mass index is  31.42 kg/m. General: Well Developed, well nourished, and in no acute distress.  Neuro: Alert and oriented x3, extra-ocular muscles intact, sensation grossly intact.  HEENT:[place]/AT, PERRLA, neck supple, No carotid bruits Skin: no gross rashes  Cardiac: Regular rate and rhythm Respiratory: Essentially clear to auscultation bilaterally. Not using accessory muscles, speaking in full sentences.  Abdominal: not grossly distended Musculoskeletal: Ambulates w/o diff, FROM * 4 ext.  Vasc: less 2 sec cap RF, warm and pink  Psych:  No HI/SI, judgement and insight good, Euthymic mood. Full Affect.    No results found for this or any previous visit (from the past 2160 hour(s)).

## 2017-03-13 NOTE — Patient Instructions (Addendum)
Please make a follow-up appointment at your convenience in the near future to come in for just fasting blood work.  We draw the blood right here.  Then since your new patient will come in about a week or 2 later to discuss the results with me.     Please realize, EXERCISE IS MEDICINE!  -  American Heart Association [facility]( AHA) guidelines for exercise : If you are in good health, without any medical conditions, you should engage in 150 minutes of moderate intensity aerobic activity per week.  This means you should be huffing and puffing throughout your workout.   Engaging in regular exercise will improve brain function and memory, as well as improve mood, boost immune system and help with weight management.  As well as the other, more well-known effects of exercise such as decreasing blood sugar levels, decreasing blood pressure,  and decreasing bad cholesterol levels/ increasing good cholesterol levels.     -  The AHA strongly endorses consumption of a diet that contains a variety of foods from all the food categories with an emphasis on fruits and vegetables; fat-free and low-fat dairy products; cereal and grain products; legumes and nuts; and fish, poultry, and/or extra lean meats.    Excessive food intake, especially of foods high in saturated and trans fats, sugar, and salt, should be avoided.    Adequate water intake of roughly 1/2 of your weight in pounds, should equal the ounces of water per day you should drink.  So for instance, if you're 200 pounds, that would be 100 ounces of water per day.         Mediterranean Diet  Why follow it? Research shows. . Those who follow the Mediterranean diet have a reduced risk of heart disease  . The diet is associated with a reduced incidence of Parkinson's and Alzheimer's diseases . People following the diet may have longer life expectancies and lower rates of chronic diseases  . The Dietary Guidelines for Americans recommends the Mediterranean diet as an  eating plan to promote health and prevent disease  What Is the Mediterranean Diet?  . Healthy eating plan based on typical foods and recipes of Mediterranean-style cooking . The diet is primarily a plant based diet; these foods should make up a majority of meals   Starches - Plant based foods should make up a majority of meals - They are an important sources of vitamins, minerals, energy, antioxidants, and fiber - Choose whole grains, foods high in fiber and minimally processed items  - Typical grain sources include wheat, oats, barley, corn, brown rice, bulgar, farro, millet, polenta, couscous  - Various types of beans include chickpeas, lentils, fava beans, black beans, white beans   Fruits  Veggies - Large quantities of antioxidant rich fruits & veggies; 6 or more servings  - Vegetables can be eaten raw or lightly drizzled with oil and cooked  - Vegetables common to the traditional Mediterranean Diet include: artichokes, arugula, beets, broccoli, brussel sprouts, cabbage, carrots, celery, collard greens, cucumbers, eggplant, kale, leeks, lemons, lettuce, mushrooms, okra, onions, peas, peppers, potatoes, pumpkin, radishes, rutabaga, shallots, spinach, sweet potatoes, turnips, zucchini - Fruits common to the Mediterranean Diet include: apples, apricots, avocados, cherries, clementines, dates, figs, grapefruits, grapes, melons, nectarines, oranges, peaches, pears, pomegranates, strawberries, tangerines  Fats - Replace butter and margarine with healthy oils, such as olive oil, canola oil, and tahini  - Limit nuts to no more than a handful a day  - Nuts include walnuts, almonds,  pecans, pistachios, pine nuts  - Limit or avoid candied, honey roasted or heavily salted nuts - Olives are central to the Mediterranean diet - can be eaten whole or used in a variety of dishes   Meats Protein - Limiting red meat: no more than a few times a month - When eating red meat: choose lean cuts and keep the portion  to the size of deck of cards - Eggs: approx. 0 to 4 times a week  - Fish and lean poultry: at least 2 a week  - Healthy protein sources include, chicken, [place], lean beef, lamb - Increase intake of seafood such as tuna, salmon, trout, mackerel, shrimp, scallops - Avoid or limit high fat processed meats such as sausage and bacon  Dairy - Include moderate amounts of low fat dairy products  - Focus on healthy dairy such as fat free yogurt, skim milk, low or reduced fat cheese - Limit dairy products higher in fat such as whole or 2% milk, cheese, ice cream  Alcohol - Moderate amounts of red wine is ok  - No more than 5 oz daily for women (all ages) and men older than age 64  - No more than 10 oz of wine daily for men younger than 81  Other - Limit sweets and other desserts  - Use herbs and spices instead of salt to flavor foods  - Herbs and spices common to the traditional Mediterranean Diet include: basil, bay leaves, chives, cloves, cumin, fennel, garlic, lavender, marjoram, mint, oregano, parsley, pepper, rosemary, sage, savory, sumac, tarragon, thyme   It's not just a diet, it's a lifestyle:  . The Mediterranean diet includes lifestyle factors typical of those in the region  . Foods, drinks and meals are best eaten with others and savored . Daily physical activity is important for overall good health . This could be strenuous exercise like running and aerobics . This could also be more leisurely activities such as walking, housework, yard-work, or taking the stairs . Moderation is the key; a balanced and healthy diet accommodates most foods and drinks . Consider portion sizes and frequency of consumption of certain foods   Meal Ideas & Options:  . Breakfast:  o Whole wheat toast or whole wheat English muffins with peanut butter & hard boiled egg o Steel cut oats topped with apples & cinnamon and skim milk  o Fresh fruit: banana, strawberries, melon, berries, peaches  o Smoothies:  strawberries, bananas, greek yogurt, peanut butter o Low fat greek yogurt with blueberries and granola  o Egg white omelet with spinach and mushrooms o Breakfast couscous: whole wheat couscous, apricots, skim milk, cranberries  . Sandwiches:  o Hummus and grilled vegetables (peppers, zucchini, squash) on whole wheat bread   o Grilled chicken on whole wheat pita with lettuce, tomatoes, cucumbers or tzatziki  o Tuna salad on whole wheat bread: tuna salad made with greek yogurt, olives, red peppers, capers, green onions o Garlic rosemary lamb pita: lamb sauted with garlic, rosemary, salt & pepper; add lettuce, cucumber, greek yogurt to pita - flavor with lemon juice and black pepper  . Seafood:  o Mediterranean grilled salmon, seasoned with garlic, basil, parsley, lemon juice and black pepper o Shrimp, lemon, and spinach whole-grain pasta salad made with low fat greek yogurt  o Seared scallops with lemon orzo  o Seared tuna steaks seasoned salt, pepper, coriander topped with tomato mixture of olives, tomatoes, olive oil, minced garlic, parsley, green onions and cappers  . Meats:  o Herbed greek chicken salad with kalamata olives, cucumber, feta  o Red bell peppers stuffed with spinach, bulgur, lean ground beef (or lentils) & topped with feta   o Kebabs: skewers of chicken, tomatoes, onions, zucchini, squash  o [place] burgers: made with red onions, mint, dill, lemon juice, feta cheese topped with roasted red peppers . Vegetarian o Cucumber salad: cucumbers, artichoke hearts, celery, red onion, feta cheese, tossed in olive oil & lemon juice  o Hummus and whole grain pita points with a greek salad (lettuce, tomato, feta, olives, cucumbers, red onion) o Lentil soup with celery, carrots made with vegetable broth, garlic, salt and pepper  o Tabouli salad: parsley, bulgur, mint, scallions, cucumbers, tomato, radishes, lemon juice, olive oil, salt and pepper.

## 2017-03-20 ENCOUNTER — Other Ambulatory Visit (INDEPENDENT_AMBULATORY_CARE_PROVIDER_SITE_OTHER): Payer: BLUE CROSS/BLUE SHIELD

## 2017-03-20 DIAGNOSIS — G43009 Migraine without aura, not intractable, without status migrainosus: Secondary | ICD-10-CM

## 2017-03-20 DIAGNOSIS — R51 Headache: Secondary | ICD-10-CM

## 2017-03-20 DIAGNOSIS — N433 Hydrocele, unspecified: Secondary | ICD-10-CM

## 2017-03-20 DIAGNOSIS — Z0000 Encounter for general adult medical examination without abnormal findings: Secondary | ICD-10-CM

## 2017-03-20 DIAGNOSIS — Z87442 Personal history of urinary calculi: Secondary | ICD-10-CM

## 2017-03-20 DIAGNOSIS — R519 Headache, unspecified: Secondary | ICD-10-CM

## 2017-03-20 NOTE — Addendum Note (Signed)
Addended by: Leda MinPULLIAM, Adalin Vanderploeg D on: 03/20/2017 09:18 AM   Modules accepted: Orders

## 2017-03-21 LAB — CBC WITH DIFFERENTIAL/PLATELET
BASOS ABS: 0 10*3/uL (ref 0.0–0.2)
Basos: 0 %
EOS (ABSOLUTE): 0.1 10*3/uL (ref 0.0–0.4)
Eos: 2 %
HEMATOCRIT: 46.2 % (ref 37.5–51.0)
Hemoglobin: 16.1 g/dL (ref 13.0–17.7)
IMMATURE GRANULOCYTES: 0 %
Immature Grans (Abs): 0 10*3/uL (ref 0.0–0.1)
LYMPHS ABS: 1.7 10*3/uL (ref 0.7–3.1)
Lymphs: 36 %
MCH: 31.8 pg (ref 26.6–33.0)
MCHC: 34.8 g/dL (ref 31.5–35.7)
MCV: 91 fL (ref 79–97)
MONOS ABS: 0.4 10*3/uL (ref 0.1–0.9)
Monocytes: 9 %
NEUTROS PCT: 53 %
Neutrophils Absolute: 2.5 10*3/uL (ref 1.4–7.0)
PLATELETS: 313 10*3/uL (ref 150–379)
RBC: 5.07 x10E6/uL (ref 4.14–5.80)
RDW: 13.1 % (ref 12.3–15.4)
WBC: 4.7 10*3/uL (ref 3.4–10.8)

## 2017-03-21 LAB — COMPREHENSIVE METABOLIC PANEL
ALK PHOS: 70 IU/L (ref 39–117)
ALT: 29 IU/L (ref 0–44)
AST: 23 IU/L (ref 0–40)
Albumin/Globulin Ratio: 2.1 (ref 1.2–2.2)
Albumin: 4.6 g/dL (ref 3.5–5.5)
BUN/Creatinine Ratio: 16 (ref 9–20)
BUN: 17 mg/dL (ref 6–24)
Bilirubin Total: 0.7 mg/dL (ref 0.0–1.2)
CALCIUM: 9.2 mg/dL (ref 8.7–10.2)
CO2: 24 mmol/L (ref 20–29)
CREATININE: 1.04 mg/dL (ref 0.76–1.27)
Chloride: 103 mmol/L (ref 96–106)
GFR calc Af Amer: 102 mL/min/{1.73_m2} (ref >59)
GFR, EST NON AFRICAN AMERICAN: 88 mL/min/{1.73_m2} (ref >59)
GLOBULIN, TOTAL: 2.2 g/dL (ref 1.5–4.5)
Glucose: 107 mg/dL — ABNORMAL HIGH (ref 65–99)
POTASSIUM: 4.4 mmol/L (ref 3.5–5.2)
SODIUM: 140 mmol/L (ref 134–144)
Total Protein: 6.8 g/dL (ref 6.0–8.5)

## 2017-03-21 LAB — TSH: TSH: 1.67 u[IU]/mL (ref 0.450–4.500)

## 2017-03-21 LAB — HEMOGLOBIN A1C
Est. average glucose Bld gHb Est-mCnc: 100 mg/dL
HEMOGLOBIN A1C: 5.1 % (ref 4.8–5.6)

## 2017-03-21 LAB — VITAMIN D 25 HYDROXY (VIT D DEFICIENCY, FRACTURES): Vit D, 25-Hydroxy: 31.6 ng/mL (ref 30.0–100.0)

## 2017-03-21 LAB — LIPID PANEL
CHOL/HDL RATIO: 5.1 ratio — AB (ref 0.0–5.0)
CHOLESTEROL TOTAL: 152 mg/dL (ref 100–199)
HDL: 30 mg/dL — AB (ref >39)
LDL Calculated: 99 mg/dL (ref 0–99)
TRIGLYCERIDES: 116 mg/dL (ref 0–149)
VLDL Cholesterol Cal: 23 mg/dL (ref 5–40)

## 2017-03-27 ENCOUNTER — Ambulatory Visit: Payer: BLUE CROSS/BLUE SHIELD | Admitting: Family Medicine

## 2017-03-27 ENCOUNTER — Encounter: Payer: Self-pay | Admitting: Family Medicine

## 2017-03-27 ENCOUNTER — Other Ambulatory Visit: Payer: BLUE CROSS/BLUE SHIELD

## 2017-03-27 VITALS — BP 138/84 | HR 89 | Ht 72.0 in | Wt 228.8 lb

## 2017-03-27 DIAGNOSIS — Z23 Encounter for immunization: Secondary | ICD-10-CM

## 2017-03-27 DIAGNOSIS — R51 Headache: Secondary | ICD-10-CM

## 2017-03-27 DIAGNOSIS — R519 Headache, unspecified: Secondary | ICD-10-CM

## 2017-03-27 DIAGNOSIS — E559 Vitamin D deficiency, unspecified: Secondary | ICD-10-CM

## 2017-03-27 MED ORDER — VITAMIN D3 125 MCG (5000 UT) PO TABS: ORAL_TABLET | ORAL | 3 refills | Status: DC

## 2017-03-27 NOTE — Progress Notes (Signed)
Assessment and plan:  1. Vitamin D insufficiency   2. Recurrent headache     There are no diagnoses linked to this encounter.  There are no diagnoses linked to this encounter.  No problem-specific Assessment & Plan notes found for this encounter.    Education and routine counseling performed. Handouts provided.  No orders of the defined types were placed in this encounter.    Return for 6-6876mo.  Marland Kitchen.   Anticipatory guidance and routine counseling done re: condition, txmnt options and need for follow up. All questions of patient's were answered.   Gross side effects, risk and benefits, and alternatives of medications discussed with patient.  Patient is aware that all medications have potential side effects and we are unable to predict every sideeffect or drug-drug interaction that may occur.  Expresses verbal understanding and consents to current therapy plan and treatment regiment.  Please see AVS handed out to patient at the end of our visit for additional patient instructions/ counseling done pertaining to today's office visit.  Note: This document was prepared using Dragon voice recognition software and may include unintentional dictation errors.   ----------------------------------------------------------------------------------------------------------------------  Subjective:   CC:   Dean Horn is a 41 y.o. male who [facility] review and discussion of recent bloodwork that was done.  1. All recent blood work that we ordered was reviewed with patient today.  Patient was counseled on all abnormalities and we discussed dietary and lifestyle changes that could help those values (also medications when appropriate).  Extensive health counseling performed and all patient's concerns/ questions were addressed.     Wt Readings from Last 3 Encounters:    03/27/17 228 lb 12.8 oz (103.8 kg)  03/13/17 231 lb 11.2 oz (105.1 kg)  06/07/13 232 lb (105.2 kg)   BP Readings from Last 3 Encounters:  03/27/17 138/84  03/13/17 120/87  06/07/13 124/93   Pulse Readings from Last 3 Encounters:  03/27/17 89  03/13/17 100  06/07/13 87   BMI Readings from Last 3 Encounters:  03/27/17 31.03 kg/m  03/13/17 31.42 kg/m  06/07/13 31.46 kg/m     Patient Care Team    Relationship Specialty Notifications Start End  Thomasene Lotpalski, Yanil Dawe, DO PCP - General Family Medicine  03/14/17     Full medical history updated and reviewed in the office today  Patient Active Problem List   Diagnosis Date Noted  . Vitamin D insufficiency 03/27/2017  . Left hydrocele 03/30/2015  . History of nephrolithiasis 12/01/2014  . Migraine without aura and without status migrainosus, not intractable 12/01/2014  . Recurrent headache 04/22/2013    Past Medical History:  Diagnosis Date  . Insomnia, unspecified   . Migraine, unspecified, without mention of intractable migraine without mention of status migrainosus   . Myalgia and myositis, unspecified   . Recurrent headache 04/22/2013    History reviewed. No pertinent surgical history.  Social History   Tobacco Use  . Smoking status: Never Smoker  . Smokeless tobacco: Never Used  Substance Use Topics  . Alcohol use: No    Family Hx: Family History  Problem Relation Age of Onset  . Diabetes Mother   . Hypertension Mother      Medications: Current Outpatient Medications  Medication Sig Dispense Refill  . zolmitriptan (ZOMIG-ZMT) 5 MG disintegrating tablet DISSOLVE ONE TABLET BY MOUTH AS NEEDED 8 tablet 3  . Cholecalciferol (VITAMIN D3) 5000 units TABS 5,000  IU OTC vitamin D3 daily. 90 tablet 3   No current facility-administered medications for this visit.     Allergies:  No Known Allergies   Review of Systems: General:   No F/C, wt loss Pulm:   No DIB, SOB, pleuritic chest pain Card:  No CP,  palpitations Abd:  No n/v/d or pain Ext:  No inc edema from baseline  Objective:  Blood pressure 138/84, pulse 89, height 6' (1.829 m), weight 228 lb 12.8 oz (103.8 kg). Body mass index is 31.03 kg/m. Gen:   Well NAD, A and O *3 HEENT:    [place]/AT, EOMI,  MMM Lungs:   Normal work of breathing. CTA B/L, no Wh, rhonchi Heart:   RRR, S1, S2 WNL's, no MRG Abd:   No gross distention Exts:    warm, pink,  Brisk capillary refill, warm and well perfused.  Psych:    No HI/SI, judgement and insight good, Euthymic mood. Full Affect.   Recent Results (from the past 2160 hour(s))  CBC with Differential/Platelet     Status: None   Collection Time: 03/20/17  9:23 AM  Result Value Ref Range   WBC 4.7 3.4 - 10.8 x10E3/uL   RBC 5.07 4.14 - 5.80 x10E6/uL   Hemoglobin 16.1 13.0 - 17.7 g/dL   Hematocrit 16.146.2 09.637.5 - 51.0 %   MCV 91 79 - 97 fL   MCH 31.8 26.6 - 33.0 pg   MCHC 34.8 31.5 - 35.7 g/dL   RDW 04.513.1 40.912.3 - 81.115.4 %   Platelets 313 150 - 379 x10E3/uL   Neutrophils 53 Not Estab. %   Lymphs 36 Not Estab. %   Monocytes 9 Not Estab. %   Eos 2 Not Estab. %   Basos 0 Not Estab. %   Neutrophils Absolute 2.5 1.4 - 7.0 x10E3/uL   Lymphocytes Absolute 1.7 0.7 - 3.1 x10E3/uL   Monocytes Absolute 0.4 0.1 - 0.9 x10E3/uL   EOS (ABSOLUTE) 0.1 0.0 - 0.4 x10E3/uL   Basophils Absolute 0.0 0.0 - 0.2 x10E3/uL   Immature Granulocytes 0 Not Estab. %   Immature Grans (Abs) 0.0 0.0 - 0.1 x10E3/uL  Comprehensive metabolic panel     Status: Abnormal   Collection Time: 03/20/17  9:23 AM  Result Value Ref Range   Glucose 107 (H) 65 - 99 mg/dL   BUN 17 6 - 24 mg/dL   Creatinine, Ser 9.141.04 0.76 - 1.27 mg/dL   GFR calc non Af Amer 88 >59 mL/min/1.73   GFR calc Af Amer 102 >59 mL/min/1.73   BUN/Creatinine Ratio 16 9 - 20   Sodium 140 134 - 144 mmol/L   Potassium 4.4 3.5 - 5.2 mmol/L   Chloride 103 96 - 106 mmol/L   CO2 24 20 - 29 mmol/L   Calcium 9.2 8.7 - 10.2 mg/dL   Total Protein 6.8 6.0 - 8.5 g/dL   Albumin  4.6 3.5 - 5.5 g/dL   Globulin, Total 2.2 1.5 - 4.5 g/dL   Albumin/Globulin Ratio 2.1 1.2 - 2.2   Bilirubin Total 0.7 0.0 - 1.2 mg/dL   Alkaline Phosphatase 70 39 - 117 IU/L   AST 23 0 - 40 IU/L   ALT 29 0 - 44 IU/L  Hemoglobin A1c     Status: None   Collection Time: 03/20/17  9:23 AM  Result Value Ref Range   Hgb A1c MFr Bld 5.1 4.8 - 5.6 %    Comment:          Prediabetes: 5.7 -  6.4          Diabetes: >6.4          Glycemic control for adults with diabetes: <7.0    Est. average glucose Bld gHb Est-mCnc 100 mg/dL  Lipid panel     Status: Abnormal   Collection Time: 03/20/17  9:23 AM  Result Value Ref Range   Cholesterol, Total 152 100 - 199 mg/dL   Triglycerides 161 0 - 149 mg/dL   HDL 30 (L) >09 mg/dL   VLDL Cholesterol Cal 23 5 - 40 mg/dL   LDL Calculated 99 0 - 99 mg/dL   Chol/HDL Ratio 5.1 (H) 0.0 - 5.0 ratio    Comment:                                   T. Chol/HDL Ratio                                             Men  Women                               1/2 Avg.Risk  3.4    3.3                                   Avg.Risk  5.0    4.4                                2X Avg.Risk  9.6    7.1                                3X Avg.Risk 23.4   11.0   TSH     Status: None   Collection Time: 03/20/17  9:23 AM  Result Value Ref Range   TSH 1.670 0.450 - 4.500 uIU/mL  VITAMIN D 25 Hydroxy (Vit-D Deficiency, Fractures)     Status: None   Collection Time: 03/20/17  9:23 AM  Result Value Ref Range   Vit D, 25-Hydroxy 31.6 30.0 - 100.0 ng/mL    Comment: Vitamin D deficiency has been defined by the Institute of Medicine and an Endocrine Society practice guideline as a level of serum 25-OH vitamin D less than 20 ng/mL (1,2). The Endocrine Society went on to further define vitamin D insufficiency as a level between 21 and 29 ng/mL (2). 1. IOM (Institute of Medicine). 2010. Dietary reference    intakes for calcium and D. [place]: The    [employer]. 2. Holick MF,  Binkley Newberry, Bischoff-Ferrari HA, et al.    Evaluation, treatment, and prevention of vitamin D    deficiency: an Endocrine Society clinical practice    guideline. JCEM. 2011 Jul; 96(7):1911-30.

## 2017-03-27 NOTE — Patient Instructions (Signed)

## 2017-09-25 ENCOUNTER — Ambulatory Visit: Payer: BLUE CROSS/BLUE SHIELD | Admitting: Family Medicine

## 2017-10-02 ENCOUNTER — Ambulatory Visit: Payer: BLUE CROSS/BLUE SHIELD | Admitting: Family Medicine

## 2017-10-02 ENCOUNTER — Encounter: Payer: Self-pay | Admitting: Family Medicine

## 2017-10-02 VITALS — BP 130/94 | HR 92 | Ht 72.0 in | Wt 229.3 lb

## 2017-10-02 DIAGNOSIS — G43009 Migraine without aura, not intractable, without status migrainosus: Secondary | ICD-10-CM | POA: Diagnosis not present

## 2017-10-02 DIAGNOSIS — E559 Vitamin D deficiency, unspecified: Secondary | ICD-10-CM

## 2017-10-02 DIAGNOSIS — E669 Obesity, unspecified: Secondary | ICD-10-CM | POA: Insufficient documentation

## 2017-10-02 DIAGNOSIS — R030 Elevated blood-pressure reading, without diagnosis of hypertension: Secondary | ICD-10-CM

## 2017-10-02 NOTE — Patient Instructions (Addendum)
Please check your blood pressure at home about 2 times per week.  If it remains greater than 135/85 on either the high number or low number, please follow-up sooner than planned so we can discuss blood pressure.   Hypertension Hypertension, commonly called high blood pressure, is when the force of blood pumping through the arteries is too strong. The arteries are the blood vessels that carry blood from the heart throughout the body. Hypertension forces the heart to work harder to pump blood and may cause arteries to become narrow or stiff. Having untreated or uncontrolled hypertension can cause heart attacks, strokes, kidney disease, and other problems. A blood pressure reading consists of a higher number over a lower number. Ideally, your blood pressure should be below 120/80. The first ("top") number is called the systolic pressure. It is a measure of the pressure in your arteries as your heart beats. The second ("bottom") number is called the diastolic pressure. It is a measure of the pressure in your arteries as the heart relaxes. What are the causes? The cause of this condition is not known. What increases the risk? Some risk factors for high blood pressure are under your control. Others are not. Factors you can change  Smoking.  Having type 2 diabetes mellitus, high cholesterol, or both.  Not getting enough exercise or physical activity.  Being overweight.  Having too much fat, sugar, calories, or salt (sodium) in your diet.  Drinking too much alcohol. Factors that are difficult or impossible to change  Having chronic kidney disease.  Having a family history of high blood pressure.  Age. Risk increases with age.  Race. You may be at higher risk if you are African-American.  Gender. Men are at higher risk than women before age 52. After age 41, women are at higher risk than men.  Having obstructive sleep apnea.  Stress. What are the signs or symptoms? Extremely high blood  pressure (hypertensive crisis) may cause:  Headache.  Anxiety.  Shortness of breath.  Nosebleed.  Nausea and vomiting.  Severe chest pain.  Jerky movements you cannot control (seizures).  How is this diagnosed? This condition is diagnosed by measuring your blood pressure while you are seated, with your arm resting on a surface. The cuff of the blood pressure monitor will be placed directly against the skin of your upper arm at the level of your heart. It should be measured at least twice using the same arm. Certain conditions can cause a difference in blood pressure between your right and left arms. Certain factors can cause blood pressure readings to be lower or higher than normal (elevated) for a short period of time:  When your blood pressure is higher when you are in a health care provider's office than when you are at home, this is called white coat hypertension. Most people with this condition do not need medicines.  When your blood pressure is higher at home than when you are in a health care provider's office, this is called masked hypertension. Most people with this condition may need medicines to control blood pressure.  If you have a high blood pressure reading during one visit or you have normal blood pressure with other risk factors:  You may be asked to return on a different day to have your blood pressure checked again.  You may be asked to monitor your blood pressure at home for 1 week or longer.  If you are diagnosed with hypertension, you may have other blood or imaging  tests to help your health care provider understand your overall risk for other conditions. How is this treated? This condition is treated by making healthy lifestyle changes, such as eating healthy foods, exercising more, and reducing your alcohol intake. Your health care provider may prescribe medicine if lifestyle changes are not enough to get your blood pressure under control, and if:  Your  systolic blood pressure is above 130.  Your diastolic blood pressure is above 80.  Your personal target blood pressure may vary depending on your medical conditions, your age, and other factors. Follow these instructions at home: Eating and drinking  Eat a diet that is high in fiber and potassium, and low in sodium, added sugar, and fat. An example eating plan is called the DASH (Dietary Approaches to Stop Hypertension) diet. To eat this way: ? Eat plenty of fresh fruits and vegetables. Try to fill half of your plate at each meal with fruits and vegetables. ? Eat whole grains, such as whole wheat pasta, brown rice, or whole grain bread. Fill about one quarter of your plate with whole grains. ? Eat or drink low-fat dairy products, such as skim milk or low-fat yogurt. ? Avoid fatty cuts of meat, processed or cured meats, and poultry with skin. Fill about one quarter of your plate with lean proteins, such as fish, chicken without skin, beans, eggs, and tofu. ? Avoid premade and processed foods. These tend to be higher in sodium, added sugar, and fat.  Reduce your daily sodium intake. Most people with hypertension should eat less than 1,500 mg of sodium a day.  Limit alcohol intake to no more than 1 drink a day for nonpregnant women and 2 drinks a day for men. One drink equals 12 oz of beer, 5 oz of wine, or 1 oz of hard liquor. Lifestyle  Work with your health care provider to maintain a healthy body weight or to lose weight. Ask what an ideal weight is for you.  Get at least 30 minutes of exercise that causes your heart to beat faster (aerobic exercise) most days of the week. Activities may include walking, swimming, or biking.  Include exercise to strengthen your muscles (resistance exercise), such as pilates or lifting weights, as part of your weekly exercise routine. Try to do these types of exercises for 30 minutes at least 3 days a week.  Do not use any products that contain nicotine or  tobacco, such as cigarettes and e-cigarettes. If you need help quitting, ask your health care provider.  Monitor your blood pressure at home as told by your health care provider.  Keep all follow-up visits as told by your health care provider. This is important. Medicines  Take over-the-counter and prescription medicines only as told by your health care provider. Follow directions carefully. Blood pressure medicines must be taken as prescribed.  Do not skip doses of blood pressure medicine. Doing this puts you at risk for problems and can make the medicine less effective.  Ask your health care provider about side effects or reactions to medicines that you should watch for. Contact a health care provider if:  You think you are having a reaction to a medicine you are taking.  You have headaches that keep coming back (recurring).  You feel dizzy.  You have swelling in your ankles.  You have trouble with your vision. Get help right away if:  You develop a severe headache or confusion.  You have unusual weakness or numbness.  You feel faint.  You have severe pain in your chest or abdomen.  You vomit repeatedly.  You have trouble breathing. Summary  Hypertension is when the force of blood pumping through your arteries is too strong. If this condition is not controlled, it may put you at risk for serious complications.  Your personal target blood pressure may vary depending on your medical conditions, your age, and other factors. For most people, a normal blood pressure is less than 120/80.  Hypertension is treated with lifestyle changes, medicines, or a combination of both. Lifestyle changes include weight loss, eating a healthy, low-sodium diet, exercising more, and limiting alcohol. This information is not intended to replace advice given to you by your health care provider. Make sure you discuss any questions you have with your health care provider. Document Released:  05/06/2005 Document Revised: 04/03/2016 Document Reviewed: 04/03/2016 Elsevier Interactive Patient Education  2018 [employer].    Check out the DASH diet = 1.5 Gram Low Sodium Diet   A 1.5 gram sodium diet restricts the amount of sodium in the diet to no more than 1.5 g or 1500 mg daily.  The American Heart Association recommends Americans over the age of 38 to consume no more than 1500 mg of sodium each day to reduce the risk of developing high blood pressure.  Research also shows that limiting sodium may reduce heart attack and stroke risk.  Many foods contain sodium for flavor and sometimes as a preservative.  When the amount of sodium in a diet needs to be low, it is important to know what to look for when choosing foods and drinks.  The following includes some information and guidelines to help make it easier for you to adapt to a low sodium diet.    QUICK TIPS  Do not add salt to food.  Avoid convenience items and fast food.  Choose unsalted snack foods.  Buy lower sodium products, often labeled as "lower sodium" or "no salt added."  Check food labels to learn how much sodium is in 1 serving.  When eating at a restaurant, ask that your food be prepared with less salt or none, if possible.    READING FOOD LABELS FOR SODIUM INFORMATION  The nutrition facts label is a good place to find how much sodium is in foods. Look for products with no more than 400 mg of sodium per serving.  Remember that 1.5 g = 1500 mg.  The food label may also list foods as:  Sodium-free: Less than 5 mg in a serving.  Very low sodium: 35 mg or less in a serving.  Low-sodium: 140 mg or less in a serving.  Light in sodium: 50% less sodium in a serving. For example, if a food that usually has 300 mg of sodium is changed to become light in sodium, it will have 150 mg of sodium.  Reduced sodium: 25% less sodium in a serving. For example, if a food that usually has 400 mg of sodium is changed to reduced sodium, it  will have 300 mg of sodium.    CHOOSING FOODS  Grains  Avoid: Salted crackers and snack items. Some cereals, including instant hot cereals. Bread stuffing and biscuit mixes. Seasoned rice or pasta mixes.  Choose: Unsalted snack items. Low-sodium cereals, oats, puffed wheat and rice, shredded wheat. English muffins and bread. Pasta.  Meats  Avoid: Salted, canned, smoked, spiced, pickled meats, including fish and poultry. Bacon, ham, sausage, cold cuts, hot dogs, anchovies.  Choose: Low-sodium canned tuna  and salmon. Fresh or frozen meat, poultry, and fish.  Dairy  Avoid: Processed cheese and spreads. Cottage cheese. Buttermilk and condensed milk. Regular cheese.  Choose: Milk. Low-sodium cottage cheese. Yogurt. Sour cream. Low-sodium cheese.  Fruits and Vegetables  Avoid: Regular canned vegetables. Regular canned tomato sauce and paste. Frozen vegetables in sauces. Olives. Rosita Fire. Relishes. Sauerkraut.  Choose: Low-sodium canned vegetables. Low-sodium tomato sauce and paste. Frozen or fresh vegetables. Fresh and frozen fruit.  Condiments  Avoid: Canned and packaged gravies. Worcestershire sauce. Tartar sauce. Barbecue sauce. Soy sauce. Steak sauce. Ketchup. Onion, garlic, and table salt. Meat flavorings and tenderizers.  Choose: Fresh and dried herbs and spices. Low-sodium varieties of mustard and ketchup. Lemon juice. Tabasco sauce. Horseradish.    SAMPLE 1.5 GRAM SODIUM MEAL PLAN:   Breakfast / Sodium (mg)  1 cup low-fat milk / 143 mg  1 whole-wheat English muffin / 240 mg  1 tbs heart-healthy margarine / 153 mg  1 hard-boiled egg / 139 mg  1 small orange / 0 mg  Lunch / Sodium (mg)  1 cup raw carrots / 76 mg  2 tbs no salt added peanut butter / 5 mg  2 slices whole-wheat bread / 270 mg  1 tbs jelly / 6 mg   cup red grapes / 2 mg  Dinner / Sodium (mg)  1 cup whole-wheat pasta / 2 mg  1 cup low-sodium tomato sauce / 73 mg  3 oz lean ground beef / 57 mg  1 small side salad (1  cup raw spinach leaves,  cup cucumber,  cup yellow bell pepper) with 1 tsp olive oil and 1 tsp red wine vinegar / 25 mg  Snack / Sodium (mg)  1 container low-fat vanilla yogurt / 107 mg  3 graham cracker squares / 127 mg  Nutrient Analysis  Calories: 1745  Protein: 75 g  Carbohydrate: 237 g  Fat: 57 g  Sodium: 1425 mg  Document Released: 05/06/2005 Document Revised: 01/16/2011 Document Reviewed: 08/07/2009  [facility] Patient Information 2012 [place], [place].     Top Ten Foods for Health  1. Water Drink at least 8 to 12 cups of water daily. Consume half of your body weight in pounds, is the amount of water in ounces to drink daily.  Ie: a 200lb person = 100 oz water daily  2. Dark Green Vegetables Eat dark green vegetables at least three to four times a week. Good options include broccoli, peppers, brussel sprouts and leafy greens like kale and spinach.  3. Whole Grains Whole grains should be included in your diet at least two to three times daily. Look for whole wheat flour, rye, oatmeal, barley, amaranth, quinoa or a multigrain. A good source of fiber includes 3 to 4 grams of fiber per serving. A great source has 5 or more grams of fiber per serving.  4. Beans and Lentils Try to eat a bean-based meal at least once a week. Try to add legumes, including beans and lentils, to soups, stews, casseroles, salads and dips or eat them plain.  5. Fish Try to eat two to three serving of fish a week. A serving consists of 3 to 4 ounces of cooked fish. Good choices are salmon, trout, herring, bluefish, sardines and tuna.  6. Berries Include two to four servings of fruit in your diet each day. Try to eat berries such as raspberries, blueberries, blackberries and strawberries.  7. Winter Squash Eat butternut and acorn squash as well as other richly pigmented dark  orange and green colored vegetables like sweet potato, cantaloupe and mango.  8. Soy 25 grams of soy protein a day is  recommended as part of a low-fat diet to help lower cholesterol levels. Try tofu, soymilk, edamame soybeans, tempeh and texturized vegetable protein (TVP).  9. Flaxseed, Nuts and Seeds Add 1 to 2 tablespoons of ground flaxseed or other seeds to food each day or include a moderate amount of nuts - 1/4 cup - in your daily diet.  10. Organic Yogurt Men and women between 40 and 20 years of age need 1000 milligrams of calcium a day and 1200 milligrams if 50 or older. Eat calcium-rich foods such as nonfat or low-fat dairy products three to four times a day. Include organic choices.

## 2017-10-02 NOTE — Progress Notes (Signed)
Impression and Recommendations:    1. Elevated blood pressure reading   2. Vitamin D insufficiency   3. Migraine without aura and without status migrainosus, not intractable   4. Obesity, Class I, BMI 30-34.9     1. Vit D insufficiency -continue supplements 2. Elevated BP -BP elevated in office today.  -prudent diet and exercise discussed.  -AHA exercise and guidelines discussed.  -less salt diet, more cardio -Goal BP: <130/80 3. migraine -stable, no problems. Continue meds prn.  4. Obesity- -recommend losing weight. Encouraged daily exercise and prudent diet.   -Drink adequate amounts of water, equal to half of your wt in oz per day.      No orders of the defined types were placed in this encounter.   No orders of the defined types were placed in this encounter.   Gross side effects, risk and benefits, and alternatives of medications and treatment plan in general discussed with patient.  Patient is aware that all medications have potential side effects and we are unable to predict every side effect or drug-drug interaction that may occur.   Patient will call with any questions prior to using medication if they have concerns.  Expresses verbal understanding and consents to current therapy and treatment regimen.  No barriers to understanding were identified.  Red flag symptoms and signs discussed in detail.  Patient expressed understanding regarding what to do in case of emergency\urgent symptoms  Please see AVS handed out to patient at the end of our visit for further patient instructions/ counseling done pertaining to today's office visit.   Return in about 6 months (around 04/04/2018) for Or sooner if blood pressure remains up or you would like to discuss weight loss.    Note: This note was prepared with assistance of Dragon voice recognition software. Occasional wrong-word or sound-a-like substitutions may have occurred due to the inherent limitations of voice  recognition software.   This document serves as a record of services personally performed by Thomasene Lot, DO. It was created on her behalf by Thelma Barge, a trained medical scribe. The creation of this record is based on the scribe's personal observations and the provider's statements to them.   I have reviewed the above medical documentation for accuracy and completeness and I concur.  Thomasene Lot 10/03/17 10:06 AM  --------------------------------------------------------------------------------------------------------------------------------------------------------------------------------------------------------------------------------------------    Subjective:     HPI: Dean Horn is a 42 y.o. male who presents to [facility] today for issues as discussed below.  He has not been working on diet/exercise since last OV. Weight is the same since last OV.  Diet/exercise He plays softball regularly. He has been drinking a lot of water recently. He has been working outside in the sun a lot as well.  He does not drink alcohol. He works a lot and stays busy throughout the day.    Vit D He has been taking his 5000 vit d supplement daily. He does not notice any difference in his mood or energy level.    He enjoys spending time with his wife and family. He is happy with his life. His personal time is working on things in his life, fixing his car, doing jobs around his house etc. 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      Wt Readings from Last 3 Encounters:  10/02/17 229 lb 4.8 oz (104 kg)  03/27/17 228 lb 12.8 oz (103.8 kg)  03/13/17  231 lb 11.2 oz (105.1 kg)   BP Readings from Last 3 Encounters:  10/02/17 (!) 130/94  03/27/17 138/84  03/13/17 120/87   Pulse Readings from Last 3 Encounters:  10/02/17 92  03/27/17 89  03/13/17 100   BMI Readings from Last 3 Encounters:  10/02/17 31.10 kg/m  03/27/17 31.03 kg/m    03/13/17 31.42 kg/m     Patient Care Team    Relationship Specialty Notifications Start End  Thomasene Lot, DO PCP - General Family Medicine  03/14/17      Patient Active Problem List   Diagnosis Date Noted  . Obesity, Class I, BMI 30-34.9 10/02/2017  . Elevated blood pressure reading 10/02/2017  . Vitamin D insufficiency 03/27/2017  . Left hydrocele 03/30/2015  . History of nephrolithiasis 12/01/2014  . Migraine without aura and without status migrainosus, not intractable 12/01/2014  . Recurrent headache 04/22/2013    Past Medical history, Surgical history, Family history, Social history, Allergies and Medications have been entered into the medical record, reviewed and changed as needed.    Current Meds  Medication Sig  . Cholecalciferol (VITAMIN D3) 5000 units TABS 5,000 IU OTC vitamin D3 daily.  Marland Kitchen zolmitriptan (ZOMIG-ZMT) 5 MG disintegrating tablet DISSOLVE ONE TABLET BY MOUTH AS NEEDED    Allergies:  No Known Allergies   Review of Systems:  A fourteen system review of systems was performed and found to be positive as per HPI.   Objective:   Blood pressure (!) 130/94, pulse 92, height 6' (1.829 m), weight 229 lb 4.8 oz (104 kg), SpO2 98 %. Body mass index is 31.1 kg/m. General:  Well Developed, well nourished, appropriate for stated age.  Neuro:  Alert and oriented,  extra-ocular muscles intact  HEENT:  Normocephalic, atraumatic, neck supple, no carotid bruits appreciated  Skin:  no gross rash, warm, pink. Cardiac:  RRR, S1 S2 Respiratory:  ECTA B/L and A/P, Not using accessory muscles, speaking in full sentences- unlabored. Vascular:  Ext warm, no cyanosis apprec.; cap RF less 2 sec. Psych:  No HI/SI, judgement and insight good, Euthymic mood. Full Affect.

## 2018-04-02 ENCOUNTER — Encounter: Payer: Self-pay | Admitting: Family Medicine

## 2018-04-02 ENCOUNTER — Ambulatory Visit: Payer: BLUE CROSS/BLUE SHIELD | Admitting: Family Medicine

## 2018-04-02 VITALS — BP 130/88 | HR 85 | Temp 98.4°F | Ht 72.0 in | Wt 242.2 lb

## 2018-04-02 DIAGNOSIS — E669 Obesity, unspecified: Secondary | ICD-10-CM

## 2018-04-02 DIAGNOSIS — Z23 Encounter for immunization: Secondary | ICD-10-CM | POA: Diagnosis not present

## 2018-04-02 DIAGNOSIS — Z833 Family history of diabetes mellitus: Secondary | ICD-10-CM

## 2018-04-02 DIAGNOSIS — Z800 Family history of malignant neoplasm of digestive organs: Secondary | ICD-10-CM

## 2018-04-02 DIAGNOSIS — E559 Vitamin D deficiency, unspecified: Secondary | ICD-10-CM | POA: Diagnosis not present

## 2018-04-02 DIAGNOSIS — G43009 Migraine without aura, not intractable, without status migrainosus: Secondary | ICD-10-CM

## 2018-04-02 DIAGNOSIS — Z8249 Family history of ischemic heart disease and other diseases of the circulatory system: Secondary | ICD-10-CM

## 2018-04-02 DIAGNOSIS — R030 Elevated blood-pressure reading, without diagnosis of hypertension: Secondary | ICD-10-CM | POA: Diagnosis not present

## 2018-04-02 DIAGNOSIS — Z83438 Family history of other disorder of lipoprotein metabolism and other lipidemia: Secondary | ICD-10-CM

## 2018-04-02 DIAGNOSIS — Z841 Family history of disorders of kidney and ureter: Secondary | ICD-10-CM

## 2018-04-02 NOTE — Patient Instructions (Addendum)
Please look into OMRON blood pressure monitor cuff for the arm.  If blood pressure is 135/85 or more on a regular basis, please return for follow-up and change in treatment plan.  Check on resources for home workout routines!     Please realize, EXERCISE IS MEDICINE!  -  American Heart Association [facility]) guidelines for exercise : If you are in good health, without any medical conditions, you should engage in 150-300 minutes of moderate intensity aerobic activity per week.  This means you should be huffing and puffing throughout your workout.   Engaging in regular exercise will improve brain function and memory, as well as improve mood, boost immune system and help with weight management.  As well as the other, more well-known effects of exercise such as decreasing blood sugar levels, decreasing blood pressure,  and decreasing bad cholesterol levels/ increasing good cholesterol levels.     -  The AHA strongly endorses consumption of a diet that contains a variety of foods from all the food categories with an emphasis on fruits and vegetables; fat-free and low-fat dairy products; cereal and grain products; legumes and nuts; and fish, poultry, and/or extra lean meats.    Excessive food intake, especially of foods high in saturated and trans fats, sugar, and salt, should be avoided.    Adequate water intake of roughly 1/2 of your weight in pounds, should equal the ounces of water per day you should drink.  So for instance, if you're 200 pounds, that would be 100 ounces of water per day.         Mediterranean Diet  Why follow it? Research shows  Those who follow the Mediterranean diet have a reduced risk of heart disease   The diet is associated with a reduced incidence of Parkinson's and Alzheimer's diseases  People following the diet may have longer life expectancies and lower rates of chronic diseases   The Dietary Guidelines for Americans recommends the Mediterranean diet as an eating  plan to promote health and prevent disease  What Is the Mediterranean Diet?   Healthy eating plan based on typical foods and recipes of Mediterranean-style cooking  The diet is primarily a plant based diet; these foods should make up a majority of meals   Starches - Plant based foods should make up a majority of meals - They are an important sources of vitamins, minerals, energy, antioxidants, and fiber - Choose whole grains, foods high in fiber and minimally processed items  - Typical grain sources include wheat, oats, barley, corn, brown rice, bulgar, farro, millet, polenta, couscous  - Various types of beans include chickpeas, lentils, fava beans, black beans, white beans   Fruits  Veggies - Large quantities of antioxidant rich fruits & veggies; 6 or more servings  - Vegetables can be eaten raw or lightly drizzled with oil and cooked  - Vegetables common to the traditional Mediterranean Diet include: artichokes, arugula, beets, broccoli, brussel sprouts, cabbage, carrots, celery, collard greens, cucumbers, eggplant, kale, leeks, lemons, lettuce, mushrooms, okra, onions, peas, peppers, potatoes, pumpkin, radishes, rutabaga, shallots, spinach, sweet potatoes, turnips, zucchini - Fruits common to the Mediterranean Diet include: apples, apricots, avocados, cherries, clementines, dates, figs, grapefruits, grapes, melons, nectarines, oranges, peaches, pears, pomegranates, strawberries, tangerines  Fats - Replace butter and margarine with healthy oils, such as olive oil, canola oil, and tahini  - Limit nuts to no more than a handful a day  - Nuts include walnuts, almonds, pecans, pistachios, pine nuts  - Limit or  avoid candied, honey roasted or heavily salted nuts - Olives are central to the Mediterranean diet - can be eaten whole or used in a variety of dishes   Meats Protein - Limiting red meat: no more than a few times a month - When eating red meat: choose lean cuts and keep the portion to the  size of deck of cards - Eggs: approx. 0 to 4 times a week  - Fish and lean poultry: at least 2 a week  - Healthy protein sources include, chicken, [place], lean beef, lamb - Increase intake of seafood such as tuna, salmon, trout, mackerel, shrimp, scallops - Avoid or limit high fat processed meats such as sausage and bacon  Dairy - Include moderate amounts of low fat dairy products  - Focus on healthy dairy such as fat free yogurt, skim milk, low or reduced fat cheese - Limit dairy products higher in fat such as whole or 2% milk, cheese, ice cream  Alcohol - Moderate amounts of red wine is ok  - No more than 5 oz daily for women (all ages) and men older than age 43  - No more than 10 oz of wine daily for men younger than 465  Other - Limit sweets and other desserts  - Use herbs and spices instead of salt to flavor foods  - Herbs and spices common to the traditional Mediterranean Diet include: basil, bay leaves, chives, cloves, cumin, fennel, garlic, lavender, marjoram, mint, oregano, parsley, pepper, rosemary, sage, savory, sumac, tarragon, thyme   Its not just a diet, its a lifestyle:   The Mediterranean diet includes lifestyle factors typical of those in the region   Foods, drinks and meals are best eaten with others and savored  Daily physical activity is important for overall good health  This could be strenuous exercise like running and aerobics  This could also be more leisurely activities such as walking, housework, yard-work, or taking the stairs  Moderation is the key; a balanced and healthy diet accommodates most foods and drinks  Consider portion sizes and frequency of consumption of certain foods   Meal Ideas & Options:   Breakfast:  o Whole wheat toast or whole wheat English muffins with peanut butter & hard boiled egg o Steel cut oats topped with apples & cinnamon and skim milk  o Fresh fruit: banana, strawberries, melon, berries, peaches  o Smoothies:  strawberries, bananas, greek yogurt, peanut butter o Low fat greek yogurt with blueberries and granola  o Egg white omelet with spinach and mushrooms o Breakfast couscous: whole wheat couscous, apricots, skim milk, cranberries   Sandwiches:  o Hummus and grilled vegetables (peppers, zucchini, squash) on whole wheat bread   o Grilled chicken on whole wheat pita with lettuce, tomatoes, cucumbers or tzatziki  o Tuna salad on whole wheat bread: tuna salad made with greek yogurt, olives, red peppers, capers, green onions o Garlic rosemary lamb pita: lamb sauted with garlic, rosemary, salt & pepper; add lettuce, cucumber, greek yogurt to pita - flavor with lemon juice and black pepper   Seafood:  o Mediterranean grilled salmon, seasoned with garlic, basil, parsley, lemon juice and black pepper o Shrimp, lemon, and spinach whole-grain pasta salad made with low fat greek yogurt  o Seared scallops with lemon orzo  o Seared tuna steaks seasoned salt, pepper, coriander topped with tomato mixture of olives, tomatoes, olive oil, minced garlic, parsley, green onions and cappers   Meats:  o Herbed greek chicken salad with Coca Colakalamata  olives, cucumber, feta  o Red bell peppers stuffed with spinach, bulgur, lean ground beef (or lentils) & topped with feta   o Kebabs: skewers of chicken, tomatoes, onions, zucchini, squash  o [place] burgers: made with red onions, mint, dill, lemon juice, feta cheese topped with roasted red peppers  Vegetarian o Cucumber salad: cucumbers, artichoke hearts, celery, red onion, feta cheese, tossed in olive oil & lemon juice  o Hummus and whole grain pita points with a greek salad (lettuce, tomato, feta, olives, cucumbers, red onion) o Lentil soup with celery, carrots made with vegetable broth, garlic, salt and pepper  o Tabouli salad: parsley, bulgur, mint, scallions, cucumbers, tomato, radishes, lemon juice, olive oil, salt and pepper.      What is Chronic Stress Syndrome,  Symptoms & Ways to Deal With it   What is Chronic Stress Syndrome?  Chronic Stress Syndrome is something which can now be called as a medical condition due to the amount of stress an individual is going through these days. Chronic Stress Syndrome causes the body and mind to shutdown and the person has no control over himself or herself. Due to the demands of modern day life and the hardship throughout day and night takes its toll over a period of time and the body and brain starts demanding rest and a break. This leads to certain symptoms where your performance level starts to dip at work, you become irritable both at work and at home, you may stop enjoying activities you previously liked, you may become depressed, you may get angry for even small things. Chronic Stress Syndrome can significantly impact your quality life. Thus it is important understand the symptoms of Chronic Stress Syndrome and react accordingly in order to cope up with it.  It is important to note here that a balanced work-home equation should be drawn to cut down symptoms of Chronic Stress Syndrome. Minor stressors can be overcome by the bodys inbuilt stress response but when there is unending stress for a long period of time then an external help is required to ease the stress.  Chronic Stress Syndrome can physically and psychologically drain you over a period of time. For such cases stress management is the best way to cope up with Chronic Stress Syndrome. If Chronic Stress Syndrome is not treated then it may result in many health hazards like anxiety, muscle pain, insomnia, and high blood pressure along with a compromised immune system leading to frequent infections and missed days from work.    What are the Symptoms of Chronic Stress Syndrome?   The symptoms of Chronic Stress Syndrome are variable and range from generalized symptoms to emotional symptoms along with behavioral and cognitive symptoms. Some of these symptoms  have been delineated below:  Generalized Symptoms of Chronic Stress Syndrome are: Anxiety Depression Social isolation Headache Abdominal pain Lack of sleep Back pain Difficulty in concentrating Hypertension Hemorrhoids Varicose veins Panic attacks/ Panic disorder Cardiovascular diseases.   Some of the Emotional Symptoms of Chronic Stress Syndrome are: To become easily agitated, moody and frustrated Feeling overwhelmed which makes you feel like you are losing control. Having difficulty relaxing and have a peaceful mind Having low self esteem Feeling lonely Feeling worthless Feeling depressed Avoiding social environment.   Some of the Physical Symptoms of Chronic Stress Syndrome are: Headaches Lethargy Alternating diarrhea and constipation Nausea Muscles aches and pains Insomnia Rapid heartbeat and chest pain Infections and frequent colds Decreased libido Nervousness and shaking Tinnitus Sweaty palms Dry mouth Clenched  jaw.  Some of the Cognitive Symptoms of Chronic Stress Syndrome are: Constant worrying Racing thoughts Disorganization and forgetfulness Inability to focus Poor judgment Abundance of negativity.  Some of the Behavioral Symptoms of Chronic Stress Syndrome are: Changes in appetite with less desire to eat Avoiding responsibilities Indulgence in alcohol or recreational drug use Increased nail biting and being fidgety Ways to Deal With Chronic Stress Syndrome    Chronic Stress Syndrome is not something which cannot be addressed. A bit of effort from your side in the form of lifestyle modifications, a little bit of exercise, a balanced work life equation can do wonders and help you get rid of Chronic Stress Syndrome.  Get Proper Sleep: It has been proved that Chronic Stress Syndrome causes loss of sleep where an individual may not even be able to sleep for days unending. This may result in the individual feeling lethargic and unable to focus at  work the following morning. This may lead to decreased performance at work. Thus, it is important to have a good sleep-wake cycle. For this, try and not drink any caffeinated beverage about four hours prior to going to sleep, as caffeine pumps up the adrenaline and causes you to stay awake resulting ultimately in Chronic Stress Syndrome.  Avoid Alcohol and Drugs: Another way to get rid of Chronic Stress Syndrome is lifestyle modifications. Stay away from alcohol and other recreational drugs. Take Short Frequent Breaks at Work: Try to take frequent breaks from work and do not work continuously. Try and manage your work in such a way that you even meet your deadline and come home on time for a happy dinner with family. A good time spent with family and kids does wonders in not only dealing with Chronic Stress Syndrome but also preventing it.  Become Physically Active: Another step towards getting rid of Chronic Stress Syndrome is physical activity. If you do not have time to spend at the gym then at least try and go for daily walks for about half an hour a day which not only keeps the stress away but also is good for your overall health. Physical activity leads to production of endorphins which will make you feel relaxed and feel good.  Healthy Diet Can Help You Deal With Chronic Stress Syndrome: Have a balanced and healthy diet is another step towards a stress free life and keeping Chronic Stress Syndrome at bay. If time is a constraint then you can try eating three small meals a day. Try and avoid fast foods and take foods which are healthy and rich in proteins, fiber, and carbohydrates to boost your energy system.  Music Can Soothe Your Mind: Light music is one of the best and most effective relaxation techniques that one can try to overcome stress. It has shown to calm down the mind and take you away from all the stressors that you may be having. These days it is also being used as a therapy in some  institutes for overcoming stress. It is important here to discuss the importance of a good social support system for patients with Chronic Stress Syndrome, as a good social support framework can do wonders in taking the stress away from the patient and overcoming Chronic Stress Syndrome.  Meditation Can Help You Deal With Chronic Stress Syndrome Effectively: Meditation and yoga has also shown to be quite effective in relaxing the mind and coping up with Chronic Stress Syndrome   In cases where these measures are not helpful, then it  is time for you to consult with a skilled psychologist or a psychiatrist for potential therapies or medications to control the stress response.   The psychologist can help you with a variety of steps for coping up with Chronic Stress Syndrome. Relaxation techniques and behavioral therapy are some of the methods employed by psychologists. In some cases, medications can also be given to help relax the patient.  Since Chronic Stress Syndrome is both emotionally and physically draining for the patient and it also adversely affects the family life of the patient hence it is important for the patient to recognize the condition and taking steps to cope up with it. Escaping measures like alcohol and drug use are of no help as they only aggravate the condition apart from their other health hazards. If this condition is ignored or left untreated it can lead to various medical conditions like anxiety and depression and various other medical conditions.  Last but not least, smile as often as you can as it is the best gift that you can give to someone. The best way to stay relaxed is to have a good smile, exercise daily, spend time with your family, meditation and if required consultation with a good psychologist so that you can live a stress free life and overcome the symptoms of Chronic Stress Syndrome.

## 2018-04-02 NOTE — Progress Notes (Signed)
Impression and Recommendations:    1. Elevated blood pressure reading   2. Obesity, Class I, BMI 30-34.9   3. Vitamin D insufficiency   4. Migraine without aura and without status migrainosus, not intractable   5. Family history of diabetes mellitus (DM)-   early onset   6. Family history of hypertension in mother   817. Family history of mixed hyperlipidemia   8. Family history of primary liver cancer   9. Family history of kidney disease in mother   4710. Flu vaccine need     1. Prevention of Styes in Eyes - Encouraged patient to wear protective gear on his face to prevent exposure to irritants.  Reviewed the importance of shielding the eyes.  - Patient may use a warm compress on his eyes if they begin to feel affected by styes.  - Advised patient to use Naphcon A if his eyes ever feel itchy.  2. Elevated Blood Pressure Reading - Reviewed normal blood pressure as 120/80 or less. - Elevated on intake again today.  - Lifestyle changes such as dash diet and engaging in a regular exercise program discussed with patient.  Educational handouts provided.  - Discussed need to purchase blood pressure cuff for home management.  Ambulatory BP monitoring strongly encouraged. Keep log and bring in next OV.  - If patient's blood pressure is more than 135/85 or more on a regular basis, patient knows to return for follow-up and change in treatment plan.  3. Vitamin D Insufficiency - Continue Vitamin D supplementation as prescribed. - Will continue to monitor.  4. Migraine without aura and without status migrainosus, not intractable - Stable at this time. - Continue treatment as prescribed. - Patient tolerating meds well without complication.  Denies S-E  5. BMI Counseling Explained to patient what BMI refers to, and what it means medically.    Told patient to think about it as a "medical risk stratification measurement" and how increasing BMI is associated with increasing risk/ or  worsening state of various diseases such as hypertension, hyperlipidemia, diabetes, premature OA, depression etc.  American Heart Association guidelines for healthy diet, basically Mediterranean diet, and exercise guidelines of 30 minutes 5 days per week or more discussed in detail.  - Encouraged patient to look into home workout routines.  Health counseling performed.  All questions answered.  6. Lifestyle & Preventative Health Maintenance - Advised patient to continue working toward exercising to improve overall mental, physical, and emotional health.    - Reviewed the "spokes of the wheel" of mood and health management.  Stressed the importance of ongoing prudent habits, including regular exercise, appropriate sleep hygiene, healthful dietary habits, and prayer/meditation to relax.  - Encouraged patient to engage in daily physical activity, especially a formal exercise routine.  Recommended that the patient eventually strive for at least 150 minutes of moderate cardiovascular activity per week according to guidelines established by the [facility] [place] [facility].   - Healthy dietary habits encouraged, including low-carb, and high amounts of lean protein in diet.   - Patient should also consume adequate amounts of water.  7. Follow-Up - Prescriptions refilled today PRN. - Re-check lab work - patient had some orange juice and is not completely fasting, but labs drawn today.  - Otherwise, continue to return for CPE and chronic follow-up as scheduled.  - Patient knows to call in sooner if desired to address acute concerns.     Orders Placed This Encounter  Procedures  . Flu Vaccine  QUAD 6+ mos PF IM (Fluarix Quad PF)  . CBC with Differential/Platelet  . Comprehensive metabolic panel  . Hemoglobin A1c  . Lipid panel  . T4, free  . TSH  . VITAMIN D 25 Hydroxy (Vit-D Deficiency, Fractures)    Gross side effects, risk and benefits, and alternatives of medications and treatment plan in general discussed  with patient.  Patient is aware that all medications have potential side effects and we are unable to predict every side effect or drug-drug interaction that may occur.   Patient will call with any questions prior to using medication if they have concerns.    Expresses verbal understanding and consents to current therapy and treatment regimen.  No barriers to understanding were identified.  Red flag symptoms and signs discussed in detail.  Patient expressed understanding regarding what to do in case of emergency\urgent symptoms  Please see AVS handed out to patient at the end of our visit for further patient instructions/ counseling done pertaining to today's office visit.   Return for 6 months or sooner if BP not at goal.     Note:  This note was prepared with assistance of Dragon voice recognition software. Occasional wrong-word or sound-a-like substitutions may have occurred due to the inherent limitations of voice recognition software.   This document serves as a record of services personally performed by Thomasene Lot, DO. It was created on her behalf by Peggye Fothergill, a trained medical scribe. The creation of this record is based on the scribe's personal observations and the provider's statements to them.   I have reviewed the above medical documentation for accuracy and completeness and I concur.  Thomasene Lot, DO, D.O. 04/03/2018 9:04 AM       -------------------------------------------------------------------------------------------------------------    Subjective:     HPI: Dean Horn is a 42 y.o. male who presents to [facility] today for issues as discussed below.  States overall doing alright.  His daughters are now aged 4 & 5. Just sold a couple of cars, Microbiologist.  Confirms that his sleep habits are good.  "I try to go to sleep reasonably."  Recent Deaths in Family Notes he recently lost his mother.  She passed  away in October.  He confirms that he lets his emotions show with his wife.  Had a cousin pass with liver cancer, 9 months after diagnosis.  Died 3 months before the patient's mother died.  Recent Styes in Eyes Notes he's had styes in his eyes, several in the last month and a half.  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  Notes that his allergies typically don't really bother him until springtime.  Vitamin D Supplementation Continues on 5000 IU's daily.  States that he takes 2-3 of they per day, up to 15,000.  He takes his medications before bed.  Migraines & Headaches States that they've been better and overall he's been doing fine.    "I've had more here lately, but I don't know why."  Feels that he experiences 1-2 migraines per month.  Tries to take ibuprofen first, which tends to alleviate his pain.  Confirms that he often strains his neck muscles and may not be sleeping ideally to prevent muscle strain.  Recent Weight Gain Patient confirms that he's gained some weight, about 13 lbs.  Patient notes he believes this has happened since he's stopped working out.  Used to go  to the gym in the morning before work.  Now he takes his little girl to school in the mornings.  Meanwhile, his wife has to take care of their other little girl.  Blood Pressure Has been elevated in the past.  He has not been checking his BP at home.  His mother was on dialysis for her kidneys, and her blood pressure was always up.  She passed away recently in Mar 13, 2023, at age 51.  She had diabetes and kept developing blood infections.   Smoking Status noted - never smoker.  - He denies new onset of: chest pain, exercise intolerance, shortness of breath, dizziness, visual changes, headache, lower extremity swelling or claudication.   Last 3 blood pressure readings in our office are as follows: BP Readings from Last 3 Encounters:  04/02/18 130/88    10/02/17 (!) 130/94  03/27/17 138/84    Filed Weights   04/02/18 0940  Weight: 242 lb 3.2 oz (109.9 kg)    Wt Readings from Last 3 Encounters:  04/02/18 242 lb 3.2 oz (109.9 kg)  10/02/17 229 lb 4.8 oz (104 kg)  03/27/17 228 lb 12.8 oz (103.8 kg)   BP Readings from Last 3 Encounters:  04/02/18 130/88  10/02/17 (!) 130/94  03/27/17 138/84   Pulse Readings from Last 3 Encounters:  04/02/18 85  10/02/17 92  03/27/17 89   BMI Readings from Last 3 Encounters:  04/02/18 32.85 kg/m  10/02/17 31.10 kg/m  03/27/17 31.03 kg/m     Patient Care Team    Relationship Specialty Notifications Start End  Thomasene Lot, DO PCP - General Family Medicine  03/14/17      Patient Active Problem List   Diagnosis Date Noted  . Family history of hypertension in mother 04/02/2018  . Family history of diabetes mellitus (DM)-   early onset 04/02/2018  . Family history of kidney disease in mother 04/02/2018  . Family history of primary liver cancer 04/02/2018  . Family history of mixed hyperlipidemia 04/02/2018  . Obesity, Class I, BMI 30-34.9 10/02/2017  . Elevated blood pressure reading 10/02/2017  . Vitamin D insufficiency 03/27/2017  . Left hydrocele 03/30/2015  . History of nephrolithiasis 12/01/2014  . Migraine without aura and without status migrainosus, not intractable 12/01/2014  . Recurrent headache 04/22/2013    Past Medical history, Surgical history, Family history, Social history, Allergies and Medications have been entered into the medical record, reviewed and changed as needed.    Current Meds  Medication Sig  . Cholecalciferol (VITAMIN D3) 5000 units TABS 5,000 IU OTC vitamin D3 daily.  Marland Kitchen zolmitriptan (ZOMIG-ZMT) 5 MG disintegrating tablet DISSOLVE ONE TABLET BY MOUTH AS NEEDED    Allergies:  No Known Allergies   Review of Systems:  A fourteen system review of systems was performed and found to be positive as per HPI.   Objective:   Blood pressure  130/88, pulse 85, temperature 98.4 F (36.9 C), height 6' (1.829 m), weight 242 lb 3.2 oz (109.9 kg), SpO2 98 %. Body mass index is 32.85 kg/m. General:  Well Developed, well nourished, appropriate for stated age.  Neuro:  Alert and oriented,  extra-ocular muscles intact  HEENT:  Normocephalic, atraumatic, neck supple, no carotid bruits appreciated  Skin:  no gross rash, warm, pink. Cardiac:  RRR, S1 S2 Respiratory:  ECTA B/L and A/P, Not using accessory muscles, speaking in full sentences- unlabored. Vascular:  Ext warm, no cyanosis apprec.; cap RF less 2 sec. Psych:  No HI/SI, judgement and insight  good, Euthymic mood. Full Affect.

## 2018-04-03 LAB — COMPREHENSIVE METABOLIC PANEL
A/G RATIO: 2 (ref 1.2–2.2)
ALBUMIN: 4.7 g/dL (ref 3.5–5.5)
ALT: 42 IU/L (ref 0–44)
AST: 31 IU/L (ref 0–40)
Alkaline Phosphatase: 70 IU/L (ref 39–117)
BILIRUBIN TOTAL: 0.8 mg/dL (ref 0.0–1.2)
BUN / CREAT RATIO: 13 (ref 9–20)
BUN: 13 mg/dL (ref 6–24)
CHLORIDE: 102 mmol/L (ref 96–106)
CO2: 24 mmol/L (ref 20–29)
Calcium: 9.5 mg/dL (ref 8.7–10.2)
Creatinine, Ser: 1.01 mg/dL (ref 0.76–1.27)
GFR calc non Af Amer: 91 mL/min/{1.73_m2} (ref >59)
GFR, EST AFRICAN AMERICAN: 105 mL/min/{1.73_m2} (ref >59)
Globulin, Total: 2.3 g/dL (ref 1.5–4.5)
Glucose: 88 mg/dL (ref 65–99)
POTASSIUM: 4.6 mmol/L (ref 3.5–5.2)
Sodium: 143 mmol/L (ref 134–144)
TOTAL PROTEIN: 7 g/dL (ref 6.0–8.5)

## 2018-04-03 LAB — CBC WITH DIFFERENTIAL/PLATELET
Basophils Absolute: 0 10*3/uL (ref 0.0–0.2)
Basos: 1 %
EOS (ABSOLUTE): 0.2 10*3/uL (ref 0.0–0.4)
Eos: 3 %
HEMOGLOBIN: 17 g/dL (ref 13.0–17.7)
Hematocrit: 48.5 % (ref 37.5–51.0)
IMMATURE GRANS (ABS): 0 10*3/uL (ref 0.0–0.1)
Immature Granulocytes: 1 %
LYMPHS ABS: 1.9 10*3/uL (ref 0.7–3.1)
Lymphs: 36 %
MCH: 31.5 pg (ref 26.6–33.0)
MCHC: 35.1 g/dL (ref 31.5–35.7)
MCV: 90 fL (ref 79–97)
MONOCYTES: 12 %
Monocytes Absolute: 0.6 10*3/uL (ref 0.1–0.9)
Neutrophils Absolute: 2.7 10*3/uL (ref 1.4–7.0)
Neutrophils: 47 %
PLATELETS: 352 10*3/uL (ref 150–450)
RBC: 5.39 x10E6/uL (ref 4.14–5.80)
RDW: 12.2 % — ABNORMAL LOW (ref 12.3–15.4)
WBC: 5.4 10*3/uL (ref 3.4–10.8)

## 2018-04-03 LAB — VITAMIN D 25 HYDROXY (VIT D DEFICIENCY, FRACTURES): VIT D 25 HYDROXY: 31.1 ng/mL (ref 30.0–100.0)

## 2018-04-03 LAB — LIPID PANEL
CHOL/HDL RATIO: 5.4 ratio — AB (ref 0.0–5.0)
Cholesterol, Total: 166 mg/dL (ref 100–199)
HDL: 31 mg/dL — ABNORMAL LOW (ref >39)
LDL CALC: 99 mg/dL (ref 0–99)
Triglycerides: 180 mg/dL — ABNORMAL HIGH (ref 0–149)
VLDL Cholesterol Cal: 36 mg/dL (ref 5–40)

## 2018-04-03 LAB — HEMOGLOBIN A1C
Est. average glucose Bld gHb Est-mCnc: 100 mg/dL
Hgb A1c MFr Bld: 5.1 % (ref 4.8–5.6)

## 2018-04-03 LAB — TSH: TSH: 2.44 u[IU]/mL (ref 0.450–4.500)

## 2018-04-03 LAB — T4, FREE: Free T4: 1.05 ng/dL (ref 0.82–1.77)

## 2018-04-30 ENCOUNTER — Telehealth: Payer: Self-pay | Admitting: Family Medicine

## 2018-04-30 NOTE — Telephone Encounter (Signed)
Forwarding message to medical assistant to call Pt's wife/ Morrie Sheldonshley @ [phone] with his lab results-- DPR on file for release.  --glh

## 2018-05-01 NOTE — Telephone Encounter (Signed)
Spoke to patient's wife and notified her of his recent lab results. MPulliam, CMA/RT(R)

## 2018-05-01 NOTE — Telephone Encounter (Signed)
Called and left message to call the office back. MPulliam, CMA/RT(R)  

## 2018-10-01 ENCOUNTER — Ambulatory Visit: Payer: BLUE CROSS/BLUE SHIELD | Admitting: Family Medicine

## 2018-10-25 ENCOUNTER — Emergency Department (HOSPITAL_COMMUNITY): Payer: BLUE CROSS/BLUE SHIELD

## 2018-10-25 ENCOUNTER — Observation Stay (HOSPITAL_COMMUNITY)
Admission: EM | Admit: 2018-10-25 | Discharge: 2018-10-26 | Disposition: A | Discharge status: Home / Self Care | Payer: BLUE CROSS/BLUE SHIELD | Attending: Orthopedic Surgery | Admitting: Orthopedic Surgery

## 2018-10-25 ENCOUNTER — Encounter (HOSPITAL_COMMUNITY): Payer: Self-pay | Admitting: *Deleted

## 2018-10-25 ENCOUNTER — Other Ambulatory Visit: Payer: Self-pay

## 2018-10-25 DIAGNOSIS — G4700 Insomnia, unspecified: Secondary | ICD-10-CM | POA: Diagnosis not present

## 2018-10-25 DIAGNOSIS — G43909 Migraine, unspecified, not intractable, without status migrainosus: Secondary | ICD-10-CM | POA: Diagnosis not present

## 2018-10-25 DIAGNOSIS — Z841 Family history of disorders of kidney and ureter: Secondary | ICD-10-CM | POA: Insufficient documentation

## 2018-10-25 DIAGNOSIS — Z800 Family history of malignant neoplasm of digestive organs: Secondary | ICD-10-CM | POA: Insufficient documentation

## 2018-10-25 DIAGNOSIS — Z833 Family history of diabetes mellitus: Secondary | ICD-10-CM | POA: Insufficient documentation

## 2018-10-25 DIAGNOSIS — Z1159 Encounter for screening for other viral diseases: Secondary | ICD-10-CM | POA: Insufficient documentation

## 2018-10-25 DIAGNOSIS — S62511B Displaced fracture of proximal phalanx of right thumb, initial encounter for open fracture: Secondary | ICD-10-CM | POA: Diagnosis not present

## 2018-10-25 DIAGNOSIS — W3189XA Contact with other specified machinery, initial encounter: Secondary | ICD-10-CM | POA: Insufficient documentation

## 2018-10-25 DIAGNOSIS — S68011A Complete traumatic metacarpophalangeal amputation of right thumb, initial encounter: Secondary | ICD-10-CM | POA: Diagnosis present

## 2018-10-25 DIAGNOSIS — Z8249 Family history of ischemic heart disease and other diseases of the circulatory system: Secondary | ICD-10-CM | POA: Diagnosis not present

## 2018-10-25 DIAGNOSIS — S68011D Complete traumatic metacarpophalangeal amputation of right thumb, subsequent encounter: Secondary | ICD-10-CM

## 2018-10-25 LAB — CBC WITH DIFFERENTIAL/PLATELET
Abs Immature Granulocytes: 0.04 10*3/uL (ref 0.00–0.07)
Basophils Absolute: 0 10*3/uL (ref 0.0–0.1)
Basophils Relative: 0 %
Eosinophils Absolute: 0.1 10*3/uL (ref 0.0–0.5)
Eosinophils Relative: 1 %
HCT: 45 % (ref 39.0–52.0)
Hemoglobin: 15.5 g/dL (ref 13.0–17.0)
Immature Granulocytes: 0 %
Lymphocytes Relative: 13 %
Lymphs Abs: 1.4 10*3/uL (ref 0.7–4.0)
MCH: 32 pg (ref 26.0–34.0)
MCHC: 34.4 g/dL (ref 30.0–36.0)
MCV: 93 fL (ref 80.0–100.0)
Monocytes Absolute: 0.7 10*3/uL (ref 0.1–1.0)
Monocytes Relative: 6 %
Neutro Abs: 8.5 10*3/uL — ABNORMAL HIGH (ref 1.7–7.7)
Neutrophils Relative %: 80 %
Platelets: 254 10*3/uL (ref 150–400)
RBC: 4.84 MIL/uL (ref 4.22–5.81)
RDW: 11.8 % (ref 11.5–15.5)
WBC: 10.7 10*3/uL — ABNORMAL HIGH (ref 4.0–10.5)
nRBC: 0 % (ref 0.0–0.2)

## 2018-10-25 LAB — SARS CORONAVIRUS 2 BY RT PCR (HOSPITAL ORDER, PERFORMED IN ~~LOC~~ HOSPITAL LAB): SARS Coronavirus 2: NEGATIVE

## 2018-10-25 LAB — COMPREHENSIVE METABOLIC PANEL
ALT: 46 U/L — ABNORMAL HIGH (ref 0–44)
AST: 46 U/L — ABNORMAL HIGH (ref 15–41)
Albumin: 3.7 g/dL (ref 3.5–5.0)
Alkaline Phosphatase: 57 U/L (ref 38–126)
Anion gap: 11 (ref 5–15)
BUN: 13 mg/dL (ref 6–20)
CO2: 21 mmol/L — ABNORMAL LOW (ref 22–32)
Calcium: 8.7 mg/dL — ABNORMAL LOW (ref 8.9–10.3)
Chloride: 110 mmol/L (ref 98–111)
Creatinine, Ser: 1.07 mg/dL (ref 0.61–1.24)
GFR calc Af Amer: >60 mL/min (ref >60)
GFR calc non Af Amer: >60 mL/min (ref >60)
Glucose, Bld: 93 mg/dL (ref 70–99)
Potassium: 4.9 mmol/L (ref 3.5–5.1)
Sodium: 142 mmol/L (ref 135–145)
Total Bilirubin: 1 mg/dL (ref 0.3–1.2)
Total Protein: 5.9 g/dL — ABNORMAL LOW (ref 6.5–8.1)

## 2018-10-25 MED ORDER — HYDROCODONE-ACETAMINOPHEN 7.5-325 MG PO TABS: 1.0000 | ORAL_TABLET | ORAL | Status: DC | PRN

## 2018-10-25 MED ORDER — ACETAMINOPHEN 325 MG PO TABS: 325.0000 mg | ORAL_TABLET | Freq: Four times a day (QID) | ORAL | Status: DC | PRN

## 2018-10-25 MED ORDER — LACTATED RINGERS IV BOLUS
1000.0000 mL | Freq: Once | INTRAVENOUS | Status: AC
  Administered 2018-10-25: 1000 mL via INTRAVENOUS

## 2018-10-25 MED ORDER — HYDROCODONE-ACETAMINOPHEN 5-325 MG PO TABS
1.0000 | ORAL_TABLET | ORAL | Status: DC | PRN
  Administered 2018-10-25 – 2018-10-26 (×4): 2 via ORAL
  Filled 2018-10-25 (×3): qty 2

## 2018-10-25 MED ORDER — MORPHINE SULFATE (PF) 4 MG/ML IV SOLN
4.0000 mg | Freq: Once | INTRAVENOUS | Status: AC
  Administered 2018-10-25: 4 mg via INTRAVENOUS
  Filled 2018-10-25: qty 1

## 2018-10-25 MED ORDER — BUPIVACAINE HCL (PF) 0.5 % IJ SOLN
10.0000 mL | Freq: Once | INTRAMUSCULAR | Status: AC
  Administered 2018-10-25: 10 mL

## 2018-10-25 MED ORDER — CEFAZOLIN SODIUM-DEXTROSE 2-4 GM/100ML-% IV SOLN
2.0000 g | Freq: Once | INTRAVENOUS | Status: AC
  Administered 2018-10-25: 2 g via INTRAVENOUS
  Filled 2018-10-25: qty 100

## 2018-10-25 NOTE — ED Notes (Signed)
Patient transported to X-ray 

## 2018-10-25 NOTE — ED Notes (Addendum)
ED TO INPATIENT HANDOFF REPORT  ED Nurse Name and Phone #:  Lonn Georgia [phone]  S Name/Age/Gender Dean Horn 43 y.o. male Room/Bed: 006C/006C  Code Status   Code Status: Full Code  Home/SNF/Other Home Patient oriented to: self, place, time and situation Is this baseline? Yes      Chief Complaint finger injury  Triage Note Pt amputated rt thumb today while on his tractor. Bleeding controlled on arrival to ED. Pt reported he felt lightheaded on arrival and SBP was 98.   Allergies No Known Allergies  Level of Care/Admitting Diagnosis ED Disposition    ED Disposition Condition [facility] Area: [place] [[id number]]  Level of Care: Med-Surg [16]  Covid Evaluation: Screening Protocol (No Symptoms)  Diagnosis: Amputation of thumb, right [858850]  Admitting Physician: Renette Butters [[id number]]  Attending Physician: Renette Butters [[id number]]  PT Class (Do Not Modify): Observation [104]  PT Acc Code (Do Not Modify): Observation [10022]       B Medical/Surgery History Past Medical History:  Diagnosis Date  . Insomnia, unspecified   . Migraine, unspecified, without mention of intractable migraine without mention of status migrainosus   . Myalgia and myositis, unspecified   . Recurrent headache 04/22/2013   History reviewed. No pertinent surgical history.   A IV Location/Drains/Wounds Patient Lines/Drains/Airways Status   Active Line/Drains/Airways    Name:   Placement date:   Placement time:   Site:   Days:   Peripheral IV 10/25/18 Left Hand   10/25/18    1829    Hand   less than 1          Intake/Output Last 24 hours  Intake/Output Summary (Last 24 hours) at 10/25/2018 2213 Last data filed at 10/25/2018 1902 Gross per 24 hour  Intake 1200 ml  Output -  Net 1200 ml    Labs/Imaging Results for orders placed or performed during the hospital encounter of 10/25/18 (from the past 48 hour(s))  CBC with Differential     Status:  Abnormal   Collection Time: 10/25/18  7:10 PM  Result Value Ref Range   WBC 10.7 (H) 4.0 - 10.5 K/uL   RBC 4.84 4.22 - 5.81 MIL/uL   Hemoglobin 15.5 13.0 - 17.0 g/dL   HCT 45.0 39.0 - 52.0 %   MCV 93.0 80.0 - 100.0 fL   MCH 32.0 26.0 - 34.0 pg   MCHC 34.4 30.0 - 36.0 g/dL   RDW 11.8 11.5 - 15.5 %   Platelets 254 150 - 400 K/uL   nRBC 0.0 0.0 - 0.2 %   Neutrophils Relative % 80 %   Neutro Abs 8.5 (H) 1.7 - 7.7 K/uL   Lymphocytes Relative 13 %   Lymphs Abs 1.4 0.7 - 4.0 K/uL   Monocytes Relative 6 %   Monocytes Absolute 0.7 0.1 - 1.0 K/uL   Eosinophils Relative 1 %   Eosinophils Absolute 0.1 0.0 - 0.5 K/uL   Basophils Relative 0 %   Basophils Absolute 0.0 0.0 - 0.1 K/uL   Immature Granulocytes 0 %   Abs Immature Granulocytes 0.04 0.00 - 0.07 K/uL    Comment: Performed at [facility], [address], [place], [place] [address]  Comprehensive metabolic panel     Status: Abnormal   Collection Time: 10/25/18  7:10 PM  Result Value Ref Range   Sodium 142 135 - 145 mmol/L   Potassium 4.9 3.5 - 5.1 mmol/L   Chloride 110  98 - 111 mmol/L   CO2 21 (L) 22 - 32 mmol/L   Glucose, Bld 93 70 - 99 mg/dL   BUN 13 6 - 20 mg/dL   Creatinine, Ser 6.571.07 0.61 - 1.24 mg/dL   Calcium 8.7 (L) 8.9 - 10.3 mg/dL   Total Protein 5.9 (L) 6.5 - 8.1 g/dL   Albumin 3.7 3.5 - 5.0 g/dL   AST 46 (H) 15 - 41 U/L   ALT 46 (H) 0 - 44 U/L   Alkaline Phosphatase 57 38 - 126 U/L   Total Bilirubin 1.0 0.3 - 1.2 mg/dL   GFR calc non Af Amer >60 >60 mL/min   GFR calc Af Amer >60 >60 mL/min   Anion gap 11 5 - 15    Comment: Performed at [facility], [address] [place], [place] [address]  SARS Coronavirus 2 (CEPHEID - Performed in [facility] hospital lab), Hosp Order     Status: None   Collection Time: 10/25/18  8:33 PM  Result Value Ref Range   SARS Coronavirus 2 NEGATIVE NEGATIVE    Comment: (NOTE) If result is NEGATIVE SARS-CoV-2 target nucleic acids are NOT DETECTED. The SARS-CoV-2 RNA is  generally detectable in upper and lower  respiratory specimens during the acute phase of infection. The lowest  concentration of SARS-CoV-2 viral copies this assay can detect is 250  copies / mL. A negative result does not preclude SARS-CoV-2 infection  and should not be used as the sole basis for treatment or other  patient management decisions.  A negative result may occur with  improper specimen collection / handling, submission of specimen other  than nasopharyngeal swab, presence of viral mutation(s) within the  areas targeted by this assay, and inadequate number of viral copies  (<250 copies / mL). A negative result must be combined with clinical  observations, patient history, and epidemiological information. If result is POSITIVE SARS-CoV-2 target nucleic acids are DETECTED. The SARS-CoV-2 RNA is generally detectable in upper and lower  respiratory specimens dur ing the acute phase of infection.  Positive  results are indicative of active infection with SARS-CoV-2.  Clinical  correlation with patient history and other diagnostic information is  necessary to determine patient infection status.  Positive results do  not rule out bacterial infection or co-infection with other viruses. If result is PRESUMPTIVE POSTIVE SARS-CoV-2 nucleic acids MAY BE PRESENT.   A presumptive positive result was obtained on the submitted specimen  and confirmed on repeat testing.  While 2019 novel coronavirus  (SARS-CoV-2) nucleic acids may be present in the submitted sample  additional confirmatory testing may be necessary for epidemiological  and / or clinical management purposes  to differentiate between  SARS-CoV-2 and other Sarbecovirus currently known to infect humans.  If clinically indicated additional testing with an alternate test  methodology [phone]([id number]) is advised. The SARS-CoV-2 RNA is generally  detectable in upper and lower respiratory sp ecimens during the acute  phase of  infection. The expected result is Negative. Fact Sheet for Patients:  BoilerBrush.com.cyhttps://www.fda.gov/media/136312/download Fact Sheet for Healthcare Providers: https://pope.com/https://www.fda.gov/media/136313/download This test is not yet approved or cleared by the [place] FDA and has been authorized for detection and/or diagnosis of SARS-CoV-2 by FDA under an Emergency Use Authorization (EUA).  This EUA will remain in effect (meaning this test can be used) for the duration of the COVID-19 declaration under Section 564(b)(1) of the Act, 21 U.S.C. section 360bbb-3(b)(1), unless the authorization is terminated or revoked sooner. Performed at  [facility], [address] [place], [place] [address]    Dg Hand Complete Right  Result Date: 10/25/2018 CLINICAL DATA:  Injury to right thumb this afternoon. EXAM: RIGHT HAND - COMPLETE 3+ VIEW COMPARISON:  None. FINDINGS: Examination demonstrates a comminuted displaced fracture involving the distal aspect of the first proximal phalanx and first distal phalanx. Moderate associated soft tissue injury. Overlying bandage is present. Remainder the exam is unremarkable. IMPRESSION: Displaced comminuted fracture involving the distal aspect of the first proximal phalanx and adjacent first distal phalanx. Electronically Signed   By: Elberta Fortisaniel  Boyle M.D.   On: 10/25/2018 20:13    Pending Labs Unresulted Labs (From admission, onward)    Start     Ordered   10/25/18 2141  HIV antibody (Routine Testing)  Once,   R     10/25/18 2144          Vitals/Pain Today's Vitals   10/25/18 1945 10/25/18 2015 10/25/18 2020 10/25/18 2030  BP: (!) 134/94 (!) 126/91  (!) 121/94  Pulse: 82 76  73  Resp:      Temp:      TempSrc:      SpO2: 100% 96%  93%  Weight:      Height:      PainSc:   5      Isolation Precautions No active isolations  Medications Medications  acetaminophen (TYLENOL) tablet 325-650 mg (has no administration in time range)  HYDROcodone-acetaminophen  (NORCO/VICODIN) 5-325 MG per tablet 1-2 tablet (has no administration in time range)  HYDROcodone-acetaminophen (NORCO) 7.5-325 MG per tablet 1-2 tablet (has no administration in time range)  ceFAZolin (ANCEF) IVPB 2g/100 mL premix (0 g Intravenous Stopped 10/25/18 2020)  lactated ringers bolus 1,000 mL (0 mLs Intravenous Stopped 10/25/18 1902)  bupivacaine (MARCAINE) 0.5 % injection 10 mL (10 mLs Infiltration Given 10/25/18 1901)  morphine 4 MG/ML injection 4 mg (4 mg Intravenous Given 10/25/18 2020)    Mobility walks Low fall risk   Focused Assessments    R Recommendations: See Admitting Provider Note  Report given to: PAM  Additional Notes:  NPO midnight

## 2018-10-25 NOTE — ED Triage Notes (Signed)
Pt amputated rt thumb today while on his tractor. Bleeding controlled on arrival to ED. Pt reported he felt lightheaded on arrival and SBP was 98.

## 2018-10-25 NOTE — ED Provider Notes (Signed)
[facility] Provider Note   CSN: [id number] Arrival date & time: 10/25/18  1804    History   Chief Complaint Chief Complaint  Patient presents with  . Hand Injury    RT thumb    HPI Dean Horn is a 43 y.o. male emergency department today with chief complaint of right thumb laceration.  Just prior to arrival patient was working with his tractor and got his right thumb caught in the blade.  When he was able to free his hand he noticed the tip of his left thumb was gone.  He has pain in the right thumb that he describes as constant aching.  He rates pain 6 out of 10 in severity.  He denies falling, denies hitting his head, no LOC.  Patient reports tetanus is up-to-date.  He was given pain medicine by EMS in route that mildly improved his pain.  Last p.o. intake was at 1:30 PM.  He is right-hand dominant.  Past Medical History:  Diagnosis Date  . Insomnia, unspecified   . Migraine, unspecified, without mention of intractable migraine without mention of status migrainosus   . Myalgia and myositis, unspecified   . Recurrent headache 04/22/2013    Patient Active Problem List   Diagnosis Date Noted  . Family history of hypertension in mother 04/02/2018  . Family history of diabetes mellitus (DM)-   early onset 04/02/2018  . Family history of kidney disease in mother 04/02/2018  . Family history of primary liver cancer 04/02/2018  . Family history of mixed hyperlipidemia 04/02/2018  . Obesity, Class I, BMI 30-34.9 10/02/2017  . Elevated blood pressure reading 10/02/2017  . Vitamin D insufficiency 03/27/2017  . Left hydrocele 03/30/2015  . History of nephrolithiasis 12/01/2014  . Migraine without aura and without status migrainosus, not intractable 12/01/2014  . Recurrent headache 04/22/2013    History reviewed. No pertinent surgical history.      Home Medications    Prior to Admission medications   Medication Sig Start Date End Date  Taking? Authorizing Provider  Cholecalciferol (VITAMIN D3) 5000 units TABS 5,000 IU OTC vitamin D3 daily. 03/27/17   Opalski, Gavin Poundeborah, DO  zolmitriptan (ZOMIG-ZMT) 5 MG disintegrating tablet DISSOLVE ONE TABLET BY MOUTH AS NEEDED 12/16/13   Huston FoleyAthar, Saima, MD    Family History Family History  Problem Relation Age of Onset  . Diabetes Mother   . Hypertension Mother     Social History Social History   Tobacco Use  . Smoking status: Never Smoker  . Smokeless tobacco: Never Used  Substance Use Topics  . Alcohol use: No  . Drug use: No     Allergies   Patient has no known allergies.   Review of Systems Review of Systems  Constitutional: Negative for chills and fever.  HENT: Negative for congestion, rhinorrhea, sinus pressure and sore throat.   Eyes: Negative for pain and redness.  Respiratory: Negative for cough, shortness of breath and wheezing.   Cardiovascular: Negative for chest pain and palpitations.  Gastrointestinal: Negative for abdominal pain, constipation, diarrhea, nausea and vomiting.  Genitourinary: Negative for dysuria.  Musculoskeletal: Positive for arthralgias and joint swelling. Negative for back pain, myalgias and neck pain.  Skin: Negative for rash and wound.  Neurological: Negative for dizziness, syncope, weakness, numbness and headaches.  Psychiatric/Behavioral: Negative for confusion.     Physical Exam Updated Vital Signs BP (!) 121/94   Pulse 73   Temp 97.6 F (36.4 C) (Oral)   Resp  18   Ht 6' (1.829 m)   Wt 109 kg   SpO2 93%   BMI 32.59 kg/m   Physical Exam Vitals signs and nursing note reviewed.  Constitutional:      General: He is not in acute distress.    Appearance: He is diaphoretic.  HENT:     Head: Normocephalic and atraumatic.     Right Ear: Tympanic membrane and external ear normal.     Left Ear: Tympanic membrane and external ear normal.     Nose: Nose normal.     Mouth/Throat:     Mouth: Mucous membranes are moist.      Pharynx: Oropharynx is clear.  Eyes:     General: No scleral icterus.       Right eye: No discharge.        Left eye: No discharge.     Extraocular Movements: Extraocular movements intact.     Conjunctiva/sclera: Conjunctivae normal.     Pupils: Pupils are equal, round, and reactive to light.  Neck:     Musculoskeletal: Normal range of motion.     Vascular: No JVD.  Cardiovascular:     Rate and Rhythm: Normal rate and regular rhythm.     Pulses: Normal pulses.          Radial pulses are 2+ on the right side and 2+ on the left side.     Heart sounds: Normal heart sounds.  Pulmonary:     Comments: Lungs clear to auscultation in all fields. Symmetric chest rise. No wheezing, rales, or rhonchi. Abdominal:     Comments: Abdomen is soft, non-distended, and non-tender in all quadrants. No rigidity, no guarding. No peritoneal signs.  Musculoskeletal: Normal range of motion.     Comments: Amputation of distal phalange right thumb as pictured below.  Bleeding is controlled.  Swelling to base of right thumb dorsal aspect.  Sensation is intact to dull and sharp.  Patient was able to wiggle all fingers.  Full range of motion of right wrist.  Skin:    General: Skin is warm.     Capillary Refill: Capillary refill takes less than 2 seconds.  Neurological:     Mental Status: He is oriented to person, place, and time.     GCS: GCS eye subscore is 4. GCS verbal subscore is 5. GCS motor subscore is 6.     Comments: Fluent speech, no facial droop.  Psychiatric:        Behavior: Behavior normal.        ED Treatments / Results  Labs (all labs ordered are listed, but only abnormal results are displayed) Labs Reviewed  CBC WITH DIFFERENTIAL/PLATELET - Abnormal; Notable for the following components:      Result Value   WBC 10.7 (*)    Neutro Abs 8.5 (*)    All other components within normal limits  COMPREHENSIVE METABOLIC PANEL - Abnormal; Notable for the following components:   CO2 21 (*)     Calcium 8.7 (*)    Total Protein 5.9 (*)    AST 46 (*)    ALT 46 (*)    All other components within normal limits  SARS CORONAVIRUS 2 (HOSPITAL ORDER, [facility])    EKG None  Radiology Dg Hand Complete Right  Result Date: 10/25/2018 CLINICAL DATA:  Injury to right thumb this afternoon. EXAM: RIGHT HAND - COMPLETE 3+ VIEW COMPARISON:  None. FINDINGS: Examination demonstrates a comminuted displaced fracture involving the distal  aspect of the first proximal phalanx and first distal phalanx. Moderate associated soft tissue injury. Overlying bandage is present. Remainder the exam is unremarkable. IMPRESSION: Displaced comminuted fracture involving the distal aspect of the first proximal phalanx and adjacent first distal phalanx. Electronically Signed   By: Elberta Fortisaniel  Boyle M.D.   On: 10/25/2018 20:13    Procedures Procedures (including critical care time)  Medications Ordered in ED Medications  ceFAZolin (ANCEF) IVPB 2g/100 mL premix (0 g Intravenous Stopped 10/25/18 2020)  lactated ringers bolus 1,000 mL (0 mLs Intravenous Stopped 10/25/18 1902)  bupivacaine (MARCAINE) 0.5 % injection 10 mL (10 mLs Infiltration Given 10/25/18 1901)  morphine 4 MG/ML injection 4 mg (4 mg Intravenous Given 10/25/18 2020)     Initial Impression / Assessment and Plan / ED Course  I have reviewed the triage vital signs and the nursing notes.  Pertinent labs & imaging results that were available during my care of the patient were reviewed by me and considered in my medical decision making (see chart for details).  On arrival patient with blood pressure of 90/78.  He is diaphoretic.  IV fluids given and blood pressure reassessed, BP improved and is now normotensive.  Exam shows amputation of distal phalanx of right thumb. Radial pulse 2+. Digital block of right thumb performed with marcaine for pain control. On reassessment pain has improved. 2g ancef given.  CBC with leukocytosis of 10.7,  otherwise unremarkable.  CMP overall unremarkable.  Patient's tetanus is up-to-date. Xray viewed by me shows displaced comminuted fracture involving the distal aspect of the first proximal phalanx and adjacent first distal phalanx. Spoke with Dr. Eulah PontMurphy who hand who agrees to assume care of patient and bring into the hospital for further evaluation and management.     This note was prepared using Dragon voice recognition software and may include unintentional dictation errors due to the inherent limitations of voice recognition software.   Final Clinical Impressions(s) / ED Diagnoses   Final diagnoses:  Traumatic amputation of right thumb, initial encounter    ED Discharge Orders    None       Kathyrn Lasslbrizze, Aynsley Fleet E, PA-C 10/26/18 0131    Blane OharaZavitz, Joshua, MD 10/26/18 [id number]    Blane OharaZavitz, Joshua, MD 10/26/18 2355

## 2018-10-25 NOTE — ED Notes (Signed)
Pt ambulatory to bathroom with no reported issues. 

## 2018-10-26 ENCOUNTER — Encounter (HOSPITAL_COMMUNITY): Payer: Self-pay

## 2018-10-26 ENCOUNTER — Observation Stay (HOSPITAL_COMMUNITY): Payer: BLUE CROSS/BLUE SHIELD | Admitting: Certified Registered Nurse Anesthetist

## 2018-10-26 ENCOUNTER — Encounter (HOSPITAL_COMMUNITY)
Admission: EM | Disposition: A | Discharge status: Home / Self Care | Payer: Self-pay | Source: Home / Self Care | Attending: Emergency Medicine

## 2018-10-26 HISTORY — PX: I & D EXTREMITY: SHX5045

## 2018-10-26 HISTORY — PX: THUMB AMPUTATION: SHX804

## 2018-10-26 LAB — HIV ANTIBODY (ROUTINE TESTING W REFLEX): HIV Screen 4th Generation wRfx: NONREACTIVE

## 2018-10-26 LAB — SURGICAL PCR SCREEN
MRSA, PCR: NEGATIVE
Staphylococcus aureus: NEGATIVE

## 2018-10-26 LAB — MRSA PCR SCREENING: MRSA by PCR: NEGATIVE

## 2018-10-26 SURGERY — IRRIGATION AND DEBRIDEMENT EXTREMITY
Anesthesia: General | Site: Hand | Laterality: Right

## 2018-10-26 MED ORDER — BUPIVACAINE HCL (PF) 0.25 % IJ SOLN
INTRAMUSCULAR | Status: AC
  Filled 2018-10-26: qty 30

## 2018-10-26 MED ORDER — LACTATED RINGERS IV SOLN
INTRAVENOUS | Status: DC | PRN
  Administered 2018-10-26: 13:00:00 via INTRAVENOUS

## 2018-10-26 MED ORDER — CEFAZOLIN SODIUM-DEXTROSE 2-4 GM/100ML-% IV SOLN
2.0000 g | INTRAVENOUS | Status: AC
  Administered 2018-10-26: 2 g via INTRAVENOUS
  Filled 2018-10-26: qty 100

## 2018-10-26 MED ORDER — MIDAZOLAM HCL 2 MG/2ML IJ SOLN
INTRAMUSCULAR | Status: AC
  Filled 2018-10-26: qty 2

## 2018-10-26 MED ORDER — FENTANYL CITRATE (PF) 250 MCG/5ML IJ SOLN
INTRAMUSCULAR | Status: AC
  Filled 2018-10-26: qty 5

## 2018-10-26 MED ORDER — PROPOFOL 10 MG/ML IV BOLUS
INTRAVENOUS | Status: DC | PRN
  Administered 2018-10-26: 200 mg via INTRAVENOUS

## 2018-10-26 MED ORDER — CEFAZOLIN SODIUM-DEXTROSE 2-4 GM/100ML-% IV SOLN: 2.0000 g | INTRAVENOUS | Status: DC

## 2018-10-26 MED ORDER — BUPIVACAINE HCL (PF) 0.25 % IJ SOLN
INTRAMUSCULAR | Status: DC | PRN
  Administered 2018-10-26: 6 mL

## 2018-10-26 MED ORDER — LIDOCAINE 2% (20 MG/ML) 5 ML SYRINGE
INTRAMUSCULAR | Status: AC
  Filled 2018-10-26: qty 5

## 2018-10-26 MED ORDER — 0.9 % SODIUM CHLORIDE (POUR BTL) OPTIME
TOPICAL | Status: DC | PRN
  Administered 2018-10-26: 1000 mL

## 2018-10-26 MED ORDER — ONDANSETRON HCL 4 MG/2ML IJ SOLN
INTRAMUSCULAR | Status: AC
  Filled 2018-10-26: qty 2

## 2018-10-26 MED ORDER — CHLORHEXIDINE GLUCONATE 4 % EX LIQD
60.0000 mL | Freq: Once | CUTANEOUS | Status: DC
  Filled 2018-10-26: qty 60

## 2018-10-26 MED ORDER — PROPOFOL 10 MG/ML IV BOLUS
INTRAVENOUS | Status: AC
  Filled 2018-10-26: qty 20

## 2018-10-26 MED ORDER — DEXAMETHASONE SODIUM PHOSPHATE 10 MG/ML IJ SOLN
INTRAMUSCULAR | Status: AC
  Filled 2018-10-26: qty 1

## 2018-10-26 MED ORDER — LIDOCAINE 2% (20 MG/ML) 5 ML SYRINGE
INTRAMUSCULAR | Status: DC | PRN
  Administered 2018-10-26: 60 mg via INTRAVENOUS

## 2018-10-26 MED ORDER — LACTATED RINGERS IV SOLN: INTRAVENOUS | Status: DC

## 2018-10-26 MED ORDER — HYDROMORPHONE HCL 1 MG/ML IJ SOLN: 0.2500 mg | INTRAMUSCULAR | Status: DC | PRN

## 2018-10-26 MED ORDER — POVIDONE-IODINE 10 % EX SWAB: 2.0000 "application " | Freq: Once | CUTANEOUS | Status: DC

## 2018-10-26 MED ORDER — CEPHALEXIN 500 MG PO CAPS: 500.0000 mg | ORAL_CAPSULE | Freq: Four times a day (QID) | ORAL | 0 refills | Status: AC

## 2018-10-26 MED ORDER — CHLORHEXIDINE GLUCONATE 4 % EX LIQD
60.0000 mL | Freq: Once | CUTANEOUS | Status: AC
  Administered 2018-10-26: 4 via TOPICAL
  Filled 2018-10-26: qty 60

## 2018-10-26 MED ORDER — MUPIROCIN 2 % EX OINT: 1.0000 "application " | TOPICAL_OINTMENT | Freq: Two times a day (BID) | CUTANEOUS | Status: DC

## 2018-10-26 MED ORDER — OXYCODONE-ACETAMINOPHEN 5-325 MG PO TABS: 1.0000 | ORAL_TABLET | Freq: Four times a day (QID) | ORAL | 0 refills | Status: AC | PRN

## 2018-10-26 MED ORDER — ACETAMINOPHEN 500 MG PO TABS: 1000.0000 mg | ORAL_TABLET | Freq: Once | ORAL | Status: DC

## 2018-10-26 MED ORDER — FENTANYL CITRATE (PF) 250 MCG/5ML IJ SOLN
INTRAMUSCULAR | Status: DC | PRN
  Administered 2018-10-26: 50 ug via INTRAVENOUS

## 2018-10-26 MED ORDER — HYDROCODONE-ACETAMINOPHEN 5-325 MG PO TABS
ORAL_TABLET | ORAL | Status: AC
  Filled 2018-10-26: qty 2

## 2018-10-26 MED ORDER — MIDAZOLAM HCL 2 MG/2ML IJ SOLN
INTRAMUSCULAR | Status: DC | PRN
  Administered 2018-10-26: 2 mg via INTRAVENOUS

## 2018-10-26 MED ORDER — DEXAMETHASONE SODIUM PHOSPHATE 10 MG/ML IJ SOLN
INTRAMUSCULAR | Status: DC | PRN
  Administered 2018-10-26: 5 mg via INTRAVENOUS

## 2018-10-26 SURGICAL SUPPLY — 62 items
BANDAGE ACE 3X5.8 VEL STRL LF (GAUZE/BANDAGES/DRESSINGS) ×2 IMPLANT
BANDAGE ACE 4X5 VEL STRL LF (GAUZE/BANDAGES/DRESSINGS) ×2 IMPLANT
BNDG CMPR 9X4 STRL LF SNTH (GAUZE/BANDAGES/DRESSINGS) ×1
BNDG COHESIVE 1X5 TAN STRL LF (GAUZE/BANDAGES/DRESSINGS) ×1 IMPLANT
BNDG CONFORM 2 STRL LF (GAUZE/BANDAGES/DRESSINGS) IMPLANT
BNDG ESMARK 4X9 LF (GAUZE/BANDAGES/DRESSINGS) ×2 IMPLANT
BNDG GAUZE ELAST 4 BULKY (GAUZE/BANDAGES/DRESSINGS) ×2 IMPLANT
CORDS BIPOLAR (ELECTRODE) ×2 IMPLANT
COVER SURGICAL LIGHT HANDLE (MISCELLANEOUS) ×2 IMPLANT
COVER WAND RF STERILE (DRAPES) ×2 IMPLANT
CUFF TOURNIQUET SINGLE 18IN (TOURNIQUET CUFF) ×2 IMPLANT
CUFF TOURNIQUET SINGLE 24IN (TOURNIQUET CUFF) IMPLANT
DRAIN PENROSE 1/4X12 LTX STRL (WOUND CARE) IMPLANT
DRAPE SURG 17X23 STRL (DRAPES) ×2 IMPLANT
DRSG ADAPTIC 3X8 NADH LF (GAUZE/BANDAGES/DRESSINGS) ×2 IMPLANT
ELECT REM PT RETURN 9FT ADLT (ELECTROSURGICAL)
ELECTRODE REM PT RTRN 9FT ADLT (ELECTROSURGICAL) IMPLANT
GAUZE SPONGE 2X2 8PLY STRL LF (GAUZE/BANDAGES/DRESSINGS) IMPLANT
GAUZE SPONGE 4X4 12PLY STRL (GAUZE/BANDAGES/DRESSINGS) ×2 IMPLANT
GAUZE XEROFORM 1X8 LF (GAUZE/BANDAGES/DRESSINGS) ×2 IMPLANT
GAUZE XEROFORM 5X9 LF (GAUZE/BANDAGES/DRESSINGS) IMPLANT
GLOVE BIO SURGEON STRL SZ 6.5 (GLOVE) ×1 IMPLANT
GLOVE BIOGEL PI IND STRL 6.5 (GLOVE) IMPLANT
GLOVE BIOGEL PI IND STRL 8.5 (GLOVE) ×1 IMPLANT
GLOVE BIOGEL PI INDICATOR 6.5 (GLOVE) ×3
GLOVE BIOGEL PI INDICATOR 8.5 (GLOVE) ×1
GLOVE ECLIPSE 6.5 STRL STRAW (GLOVE) ×1 IMPLANT
GLOVE SURG ORTHO 8.0 STRL STRW (GLOVE) ×2 IMPLANT
GOWN STRL REUS W/ TWL LRG LVL3 (GOWN DISPOSABLE) ×3 IMPLANT
GOWN STRL REUS W/ TWL XL LVL3 (GOWN DISPOSABLE) ×1 IMPLANT
GOWN STRL REUS W/TWL LRG LVL3 (GOWN DISPOSABLE) ×8
GOWN STRL REUS W/TWL XL LVL3 (GOWN DISPOSABLE) ×2
HANDPIECE INTERPULSE COAX TIP (DISPOSABLE)
KIT BASIN OR (CUSTOM PROCEDURE TRAY) ×2 IMPLANT
KIT TURNOVER KIT B (KITS) ×2 IMPLANT
MANIFOLD NEPTUNE II (INSTRUMENTS) ×2 IMPLANT
NDL HYPO 25GX1X1/2 BEV (NEEDLE) IMPLANT
NEEDLE HYPO 25GX1X1/2 BEV (NEEDLE) ×2 IMPLANT
NS IRRIG 1000ML POUR BTL (IV SOLUTION) ×2 IMPLANT
PACK ORTHO EXTREMITY (CUSTOM PROCEDURE TRAY) ×2 IMPLANT
PAD ARMBOARD 7.5X6 YLW CONV (MISCELLANEOUS) ×4 IMPLANT
PAD CAST 4YDX4 CTTN HI CHSV (CAST SUPPLIES) ×1 IMPLANT
PADDING CAST COTTON 4X4 STRL (CAST SUPPLIES) ×2
SET CYSTO W/LG BORE CLAMP LF (SET/KITS/TRAYS/PACK) IMPLANT
SET HNDPC FAN SPRY TIP SCT (DISPOSABLE) IMPLANT
SOAP 2 % CHG 4 OZ (WOUND CARE) ×2 IMPLANT
SPONGE GAUZE 2X2 STER 10/PKG (GAUZE/BANDAGES/DRESSINGS) ×1
SPONGE LAP 18X18 RF (DISPOSABLE) ×2 IMPLANT
SPONGE LAP 4X18 RFD (DISPOSABLE) ×2 IMPLANT
SUT ETHILON 4 0 PS 2 18 (SUTURE) IMPLANT
SUT ETHILON 5 0 P 3 18 (SUTURE)
SUT NYLON ETHILON 5-0 P-3 1X18 (SUTURE) IMPLANT
SUT PROLENE 4 0 PS 2 18 (SUTURE) ×1 IMPLANT
SWAB COLLECTION DEVICE MRSA (MISCELLANEOUS) ×2 IMPLANT
SWAB CULTURE ESWAB REG 1ML (MISCELLANEOUS) IMPLANT
SYR CONTROL 10ML LL (SYRINGE) ×1 IMPLANT
TOWEL OR 17X24 6PK STRL BLUE (TOWEL DISPOSABLE) ×2 IMPLANT
TOWEL OR 17X26 10 PK STRL BLUE (TOWEL DISPOSABLE) ×2 IMPLANT
TUBE CONNECTING 12X1/4 (SUCTIONS) ×2 IMPLANT
UNDERPAD 30X30 (UNDERPADS AND DIAPERS) ×2 IMPLANT
WATER STERILE IRR 1000ML POUR (IV SOLUTION) ×2 IMPLANT
YANKAUER SUCT BULB TIP NO VENT (SUCTIONS) ×2 IMPLANT

## 2018-10-26 NOTE — Progress Notes (Addendum)
Pt to short stay, A&O x4. Consent signed by pt. NPO post midnight maint.  1605 Received back from PACU, A&O X4. Right thumb dressing dry and intact. Denies pain at this time. 1700 Pt requests to talk to Dr Caralyn Guile, message sent. Dr Caralyn Guile was able to talk to the pt thru phone. 1900 Pt has no complaints, tolerated diet, no pain. Discharge instructions was given to pt. Discharged to home picked up by wife.

## 2018-10-26 NOTE — Plan of Care (Signed)
  Problem: Pain Managment: Goal: General experience of comfort will improve Outcome: Progressing   Problem: Safety: Goal: Ability to remain free from injury will improve Outcome: Progressing   Problem: Skin Integrity: Goal: Risk for impaired skin integrity will decrease Outcome: Progressing   

## 2018-10-26 NOTE — Transfer of Care (Signed)
Immediate Anesthesia Transfer of Care Note  Patient: REYAN HELLE  Procedure(s) Performed: IRRIGATION AND DEBRIDEMENT EXTREMITY (Right Hand)  Patient Location: PACU  Anesthesia Type:General  Level of Consciousness: awake, alert  and oriented  Airway & Oxygen Therapy: Patient Spontanous Breathing  Post-op Assessment: Report given to RN and Post -op Vital signs reviewed and stable  Post vital signs: Reviewed and stable  Last Vitals:  Vitals Value Taken Time  BP 114/85 10/26/2018  3:27 PM  Temp 36.2 C 10/26/2018  3:27 PM  Pulse 74 10/26/2018  3:39 PM  Resp 14 10/26/2018  3:39 PM  SpO2 100 % 10/26/2018  3:39 PM  Vitals shown include unvalidated device data.  Last Pain:  Vitals:   10/26/18 1527  TempSrc:   PainSc: 0-No pain      Patients Stated Pain Goal: 2 (22/63/33 5456)  Complications: No apparent anesthesia complications

## 2018-10-26 NOTE — Anesthesia Postprocedure Evaluation (Signed)
Anesthesia Post Note  Patient: Dean Horn  Procedure(s) Performed: IRRIGATION AND DEBRIDEMENT EXTREMITY (Right Hand)     Patient location during evaluation: PACU Anesthesia Type: General Level of consciousness: awake and alert Pain management: pain level controlled Vital Signs Assessment: post-procedure vital signs reviewed and stable Respiratory status: spontaneous breathing, nonlabored ventilation and respiratory function stable Cardiovascular status: blood pressure returned to baseline and stable Postop Assessment: no apparent nausea or vomiting Anesthetic complications: no    Last Vitals:  Vitals:   10/26/18 1542 10/26/18 1608  BP: (!) 134/95 (!) 124/97  Pulse: 70 70  Resp: 12 16  Temp: (!) 36.2 C 36.6 C  SpO2: 98% 98%    Last Pain:  Vitals:   10/26/18 1608  TempSrc: Oral  PainSc: 0-No pain                 Kadince Boxley,W. EDMOND

## 2018-10-26 NOTE — Anesthesia Procedure Notes (Signed)
Procedure Name: LMA Insertion Date/Time: 10/26/2018 2:25 PM Performed by: Oletta Lamas, CRNA Pre-anesthesia Checklist: Patient identified, Emergency Drugs available, Suction available and Patient being monitored Patient Re-evaluated:Patient Re-evaluated prior to induction Oxygen Delivery Method: Circle System Utilized Preoxygenation: Pre-oxygenation with 100% oxygen Induction Type: IV induction Ventilation: Mask ventilation without difficulty LMA: LMA inserted LMA Size: 4.0 and 5.0 Number of attempts: 1 Placement Confirmation: positive ETCO2 Tube secured with: Tape Dental Injury: Teeth and Oropharynx as per pre-operative assessment

## 2018-10-26 NOTE — Progress Notes (Signed)
PT Cancellation Note  Patient Details Name: ABDULHADI STOPA MRN: [id number] DOB: 1975-09-19   Cancelled Treatment:    Reason Eval/Treat Not Completed: PT screened, no needs identified, will sign off Pt reports he is independent with mobility and ambulation while in the hospital and has no issues. Does not require PT evaluation. Will sign off. Please re consult if anything changes. Thanks.   [name] 10/26/2018, 11:48 AM Wray Kearns, PT, DPT Acute Rehabilitation Services Pager [phone] Office [phone]

## 2018-10-26 NOTE — Consult Note (Signed)
Reason for Consult:Thumb amputation Referring Physician: K Albrizze  Dean Horn is an 43 y.o. male.  HPI: Dean Horn was attempting to slow down a spinning PTO on his tractor and his thumb got caught and part of it was torn off. He came to the ED for evaluation and hand surgery was consulted. He is RHD. He works Probation officer.  Past Medical History:  Diagnosis Date  . Insomnia, unspecified   . Migraine, unspecified, without mention of intractable migraine without mention of status migrainosus   . Myalgia and myositis, unspecified   . Recurrent headache 04/22/2013    History reviewed. No pertinent surgical history.  Family History  Problem Relation Age of Onset  . Diabetes Mother   . Hypertension Mother     Social History:  reports that he has never smoked. He has never used smokeless tobacco. He reports that he does not drink alcohol or use drugs.  Allergies: No Known Allergies  Medications: I have reviewed the patient's current medications.  Results for orders placed or performed during the hospital encounter of 10/25/18 (from the past 48 hour(s))  CBC with Differential     Status: Abnormal   Collection Time: 10/25/18  7:10 PM  Result Value Ref Range   WBC 10.7 (H) 4.0 - 10.5 K/uL   RBC 4.84 4.22 - 5.81 MIL/uL   Hemoglobin 15.5 13.0 - 17.0 g/dL   HCT 45.0 39.0 - 52.0 %   MCV 93.0 80.0 - 100.0 fL   MCH 32.0 26.0 - 34.0 pg   MCHC 34.4 30.0 - 36.0 g/dL   RDW 11.8 11.5 - 15.5 %   Platelets 254 150 - 400 K/uL   nRBC 0.0 0.0 - 0.2 %   Neutrophils Relative % 80 %   Neutro Abs 8.5 (H) 1.7 - 7.7 K/uL   Lymphocytes Relative 13 %   Lymphs Abs 1.4 0.7 - 4.0 K/uL   Monocytes Relative 6 %   Monocytes Absolute 0.7 0.1 - 1.0 K/uL   Eosinophils Relative 1 %   Eosinophils Absolute 0.1 0.0 - 0.5 K/uL   Basophils Relative 0 %   Basophils Absolute 0.0 0.0 - 0.1 K/uL   Immature Granulocytes 0 %   Abs Immature Granulocytes 0.04 0.00 - 0.07 K/uL    Comment: Performed at [facility], [address] [place], [place] [address]  Comprehensive metabolic panel     Status: Abnormal   Collection Time: 10/25/18  7:10 PM  Result Value Ref Range   Sodium 142 135 - 145 mmol/L   Potassium 4.9 3.5 - 5.1 mmol/L   Chloride 110 98 - 111 mmol/L   CO2 21 (L) 22 - 32 mmol/L   Glucose, Bld 93 70 - 99 mg/dL   BUN 13 6 - 20 mg/dL   Creatinine, Ser 1.07 0.61 - 1.24 mg/dL   Calcium 8.7 (L) 8.9 - 10.3 mg/dL   Total Protein 5.9 (L) 6.5 - 8.1 g/dL   Albumin 3.7 3.5 - 5.0 g/dL   AST 46 (H) 15 - 41 U/L   ALT 46 (H) 0 - 44 U/L   Alkaline Phosphatase 57 38 - 126 U/L   Total Bilirubin 1.0 0.3 - 1.2 mg/dL   GFR calc non Af Amer >60 >60 mL/min   GFR calc Af Amer >60 >60 mL/min   Anion gap 11 5 - 15    Comment: Performed at [facility], [place] [address] [place], [place] [address]  SARS Coronavirus 2 (CEPHEID -  Performed in [facility] hospital lab), Hosp Order     Status: None   Collection Time: 10/25/18  8:33 PM  Result Value Ref Range   SARS Coronavirus 2 NEGATIVE NEGATIVE    Comment: (NOTE) If result is NEGATIVE SARS-CoV-2 target nucleic acids are NOT DETECTED. The SARS-CoV-2 RNA is generally detectable in upper and lower  respiratory specimens during the acute phase of infection. The lowest  concentration of SARS-CoV-2 viral copies this assay can detect is 250  copies / mL. A negative result does not preclude SARS-CoV-2 infection  and should not be used as the sole basis for treatment or other  patient management decisions.  A negative result may occur with  improper specimen collection / handling, submission of specimen other  than nasopharyngeal swab, presence of viral mutation(s) within the  areas targeted by this assay, and inadequate number of viral copies  (<250 copies / mL). A negative result must be combined with clinical  observations, patient history, and epidemiological information. If result is POSITIVE SARS-CoV-2 target nucleic acids are DETECTED. The  SARS-CoV-2 RNA is generally detectable in upper and lower  respiratory specimens dur ing the acute phase of infection.  Positive  results are indicative of active infection with SARS-CoV-2.  Clinical  correlation with patient history and other diagnostic information is  necessary to determine patient infection status.  Positive results do  not rule out bacterial infection or co-infection with other viruses. If result is PRESUMPTIVE POSTIVE SARS-CoV-2 nucleic acids MAY BE PRESENT.   A presumptive positive result was obtained on the submitted specimen  and confirmed on repeat testing.  While 2019 novel coronavirus  (SARS-CoV-2) nucleic acids may be present in the submitted sample  additional confirmatory testing may be necessary for epidemiological  and / or clinical management purposes  to differentiate between  SARS-CoV-2 and other Sarbecovirus currently known to infect humans.  If clinically indicated additional testing with an alternate test  methodology [phone] is advised. The SARS-CoV-2 RNA is generally  detectable in upper and lower respiratory sp ecimens during the acute  phase of infection. The expected result is Negative. Fact Sheet for Patients:  BoilerBrush.com.cyhttps://www.fda.gov/media/136312/download Fact Sheet for Healthcare Providers: https://pope.com/https://www.fda.gov/media/136313/download This test is not yet approved or cleared by the [place] FDA and has been authorized for detection and/or diagnosis of SARS-CoV-2 by FDA under an Emergency Use Authorization (EUA).  This EUA will remain in effect (meaning this test can be used) for the duration of the COVID-19 declaration under Section 564(b)(1) of the Act, 21 U.S.C. section 360bbb-3(b)(1), unless the authorization is terminated or revoked sooner. Performed at [facility], [address] [place], [place] [address]   MRSA PCR Screening     Status: None   Collection Time: 10/25/18 11:30 PM  Result Value Ref Range   MRSA by PCR  NEGATIVE NEGATIVE    Comment:        The GeneXpert MRSA Assay (FDA approved for NASAL specimens only), is one component of a comprehensive MRSA colonization surveillance program. It is not intended to diagnose MRSA infection nor to guide or monitor treatment for MRSA infections. Performed at [facility], [address] [place], [place] [address]   Surgical PCR screen     Status: None   Collection Time: 10/26/18  3:44 AM  Result Value Ref Range   MRSA, PCR NEGATIVE NEGATIVE   Staphylococcus aureus NEGATIVE NEGATIVE    Comment: (NOTE) The Xpert SA Assay (FDA approved for NASAL specimens  in patients 44 years of age and older), is one component of a comprehensive surveillance program. It is not intended to diagnose infection nor to guide or monitor treatment. Performed at [facility], [address] [place], [place] [address]     Dg Hand Complete Right  Result Date: 10/25/2018 CLINICAL DATA:  Injury to right thumb this afternoon. EXAM: RIGHT HAND - COMPLETE 3+ VIEW COMPARISON:  None. FINDINGS: Examination demonstrates a comminuted displaced fracture involving the distal aspect of the first proximal phalanx and first distal phalanx. Moderate associated soft tissue injury. Overlying bandage is present. Remainder the exam is unremarkable. IMPRESSION: Displaced comminuted fracture involving the distal aspect of the first proximal phalanx and adjacent first distal phalanx. Electronically Signed   By: Elberta Fortisaniel  Boyle M.D.   On: 10/25/2018 20:13    Review of Systems  Constitutional: Negative for weight loss.  HENT: Negative for ear discharge, ear pain, hearing loss and tinnitus.   Eyes: Negative for blurred vision, double vision, photophobia and pain.  Respiratory: Negative for cough, sputum production and shortness of breath.   Cardiovascular: Negative for chest pain.  Gastrointestinal: Negative for abdominal pain, nausea and vomiting.  Genitourinary: Negative for dysuria,  flank pain, frequency and urgency.  Musculoskeletal: Positive for joint pain (Right thumb). Negative for back pain, falls, myalgias and neck pain.  Neurological: Negative for dizziness, tingling, sensory change, focal weakness, loss of consciousness and headaches.  Endo/Heme/Allergies: Does not bruise/bleed easily.  Psychiatric/Behavioral: Negative for depression, memory loss and substance abuse. The patient is not nervous/anxious.    Blood pressure 128/87, pulse 64, temperature 97.7 F (36.5 C), temperature source Oral, resp. rate 16, height 6' (1.829 m), weight 109 kg, SpO2 99 %. Physical Exam  Constitutional: He appears well-developed and well-nourished. No distress.  HENT:  Head: Normocephalic and atraumatic.  Eyes: Conjunctivae are normal. Right eye exhibits no discharge. Left eye exhibits no discharge. No scleral icterus.  Neck: Normal range of motion.  Cardiovascular: Normal rate and regular rhythm.  Respiratory: Effort normal. No respiratory distress.  Musculoskeletal:     Comments: Right shoulder, elbow, wrist, digits- Partial amputation thumb, TTP, no instability, no blocks to motion  Sens  Ax/R/M/U intact  Mot   Ax/ R/ PIN/ M/ AIN/ U intact  Rad 2+  Neurological: He is alert.  Skin: Skin is warm and dry. He is not diaphoretic.  Psychiatric: He has a normal mood and affect. His behavior is normal.    Assessment/Plan: Right thumb amputation -- For revision amputation by Dr. Melvyn Novasrtmann this afternoon. NPO until then. Anticipate discharge after surgery.    Freeman CaldronMichael J. Coraleigh Sheeran, PA-C Orthopedic Surgery [phone] 10/26/2018, 9:05 AM

## 2018-10-26 NOTE — Consult Note (Signed)
I have reviewed his clinical images and discussed with EDP   Plan is to admit to [facility], Dr. Caralyn Guile with take over care in the AM and plans to take him to the OR for soft tissue coverage.   Renette Butters

## 2018-10-26 NOTE — Consult Note (Signed)
The patient was seen and examined today. Patient was admitted yesterday after sustaining a bush hog injury to his right thumb.  Patient sustained the amputation through the level of the distal portion of the proximal phalanx.  He did not bring the end of the thumb back in.  Patient is scheduled today for revision amputation and soft tissue coverage. I saw and evaluated the patient in the preoperative holding area.  The patient voiced understanding.  Our goal is to debride the open fracture irrigate the soft tissues debride the devitalized tissue and close the defect trying to preserve as much swelling to the proximal phalanx as possible. The risks of surgery include but not limited to bleeding infection damage nearby nerves arteries or tendons loss of motion of the wrist and digits incomplete relief of symptoms and need for further surgical intervention. Signed informed consent was obtained. The patient can go home following the surgery.  R/B/A DISCUSSED WITH PT IN THE HOSPITAL.  PT VOICED UNDERSTANDING OF PLAN CONSENT SIGNED DAY OF SURGERY PT SEEN AND EXAMINED PRIOR TO OPERATIVE PROCEDURE/DAY OF SURGERY SITE MARKED. QUESTIONS ANSWERED WILL GO HOME FOLLOWING SURGERY  WE ARE PLANNING SURGERY FOR YOUR UPPER EXTREMITY. THE RISKS AND BENEFITS OF SURGERY INCLUDE BUT NOT LIMITED TO BLEEDING INFECTION, DAMAGE TO NEARBY NERVES ARTERIES TENDONS, FAILURE OF SURGERY TO ACCOMPLISH ITS INTENDED GOALS, PERSISTENT SYMPTOMS AND NEED FOR FURTHER SURGICAL INTERVENTION. WITH THIS IN MIND WE WILL PROCEED. I HAVE DISCUSSED WITH THE PATIENT THE PRE AND POSTOPERATIVE REGIMEN AND THE DOS AND DON'TS. PT VOICED UNDERSTANDING AND INFORMED CONSENT SIGNED.

## 2018-10-26 NOTE — Discharge Instructions (Signed)
KEEP BANDAGE CLEAN AND DRY CALL OFFICE FOR F/U APPT [phone] IN 8 DAYS RX SENT TO [facility] [address] KEEP HAND ELEVATED ABOVE HEART OK TO APPLY ICE TO OPERATIVE AREA CONTACT OFFICE IF ANY WORSENING PAIN OR CONCERNS.

## 2018-10-26 NOTE — Op Note (Signed)
PREOPERATIVE DIAGNOSIS: Right thumb open amputation through the level of the interphalangeal joint Right thumb comminuted proximal phalanx fracture involving the articular surface of the IP joint  POSTOPERATIVE DIAGNOSIS: Same  ATTENDING SURGEON: Dr. Iran Planas who is scrubbed and present the entire procedure  ASSISTANT SURGEON: Gertie Fey, PA-C was scrubbed and necessary to aid in revision amputation debridement closure and splinting in a timely fashion  ANESTHESIA: General via LMA  OPERATIVE PROCEDURE: #1: Open debridement of skin subcutaneous tissue and bone associated with open right thumb proximal phalanx fracture #2: Right thumb revision amputation to the level of the proximal phalanx with local neurectomies and advancement flap closure #3: Radiographs 2 views right thumb  IMPLANTS: None  RADIOGRAPHIC INTERPRETATION: AP and lateral views of the thumb do show the amputation to the level of the proximal phalanx with adequate soft tissue coverage  SURGICAL INDICATIONS: Patient is a right-hand-dominant gentleman who sustained the open injury to his thumb yesterday.  The patient did not bring in the distal portion of the thumb.  Patient was seen and evaluated and recommended undergo the above procedure.  Risk benefits and alternatives were discussed in detail with the patient and signed informed consent was obtained.  SURGICAL TECHNIQUE: Patient was palpated find the preoperative holding area to mark the permanent marker made on the right thumb indicate the correct operative site.  Patient brought back to operating placed supine on the anesthesia table where the general anesthetic was administered.  Preoperative antibiotics were given prior to skin incision.  A well-padded tourniquet was then placed on the right brachium and stay with appropriate drape.  The right upper extremities then prepped and draped normal sterile fashion.  A timeout was called the correct site was identified the  procedure then begun.  Attention was then turned the right thumb the limb was then elevated the tourniquet insufflated.  Excisional debridement of the skin subcutaneous tissue and bone was then carried out of the open fracture.  The devitalized condyles of the proximal phalanx were then removed.  The loose fragments of bone from the proximal phalanx were then carefully debrided.  This was done sharp with a knife.  The devitalized skin was then excised sharply with a knife and sharp scissors.  Local neurectomies was then were then carried out on the radial and ulnar neurovascular bundles.  The wound was copiously irrigated.  Revision amputation was then done of the proximal phalanx smoothing it down to a nice smooth surface to allow for wound closure.  The wound was irrigated.  After this the skin flaps were then advanced from a dorsal to volar direction allowing for soft tissue coverage directly over the proximal phalanx without significant tension on the closure.  Skin was then closed using 4-0 Prolene suture.  Adaptic dressing a sterile compressive bandage then applied.  The patient was extubated taken recovery in good condition.  POSTOPERATIVE PLAN: Patient be discharged to home.  Home on oral antibiotics and pain medicine.  Follow-up in the office in 8 days for wound check.  Spent sent down to see our therapist for small tip protector splint in 8 days.  Radiographs at each visit.

## 2018-10-26 NOTE — Discharge Summary (Signed)
Physician Discharge Summary  Patient ID: Dean Horn MRN: [id number] DOB/AGE: 12/08/1975 43 y.o.  Admit date: 10/25/2018 Discharge date:  10/26/2018  Admission Diagnoses: Infected Right Thumb Past Medical History:  Diagnosis Date  . Insomnia, unspecified   . Migraine, unspecified, without mention of intractable migraine without mention of status migrainosus   . Myalgia and myositis, unspecified   . Recurrent headache 04/22/2013    Discharge Diagnoses:  Active Problems:   Amputation of thumb, right   Surgeries: Procedure(s): IRRIGATION AND DEBRIDEMENT EXTREMITY on 10/26/2018    Consultants:   Discharged Condition: Improved  Hospital Course: Dean EssexDaniel R Horn is an 43 y.o. male who was admitted 10/25/2018 with a chief complaint of  Chief Complaint  Patient presents with  . Hand Injury    RT thumb  , and found to have a diagnosis of Infected Right Thumb.  They were brought to the operating room on 10/26/2018 and underwent Procedure(s): IRRIGATION AND DEBRIDEMENT EXTREMITY.    They were given perioperative antibiotics:  Anti-infectives (From admission, onward)   Start     Dose/Rate Route Frequency Ordered Stop   10/27/18 0600  ceFAZolin (ANCEF) IVPB 2g/100 mL premix  Status:  Discontinued     2 g 200 mL/hr over 30 Minutes Intravenous On call to O.R. 10/26/18 1237 10/26/18 1251   10/26/18 1000  ceFAZolin (ANCEF) IVPB 2g/100 mL premix     2 g 200 mL/hr over 30 Minutes Intravenous To Short Stay 10/26/18 0944 10/26/18 1432   10/26/18 0000  cephALEXin (KEFLEX) 500 MG capsule     500 mg Oral 4 times daily 10/26/18 1414 11/05/18 2359   10/25/18 1845  ceFAZolin (ANCEF) IVPB 2g/100 mL premix     2 g 200 mL/hr over 30 Minutes Intravenous  Once 10/25/18 1830 10/25/18 2020    .  They were given sequential compression devices, early ambulation, and Other (comment) for DVT prophylaxis.  Recent vital signs:  Patient Vitals for the past 24 hrs:  BP Temp Temp src Pulse Resp SpO2 Height  Weight  10/26/18 1312 - - - - - - 6' (1.829 m) 109 kg  10/26/18 0806 128/87 97.7 F (36.5 C) Oral 64 16 99 % - -  10/26/18 0421 (!) 120/96 97.8 F (36.6 C) Oral 75 - 98 % - -  10/25/18 2321 131/89 98.6 F (37 C) Oral 87 - 98 % - -  10/25/18 2100 (!) 120/91 - - 69 - 99 % - -  10/25/18 2030 (!) 121/94 - - 73 - 93 % - -  10/25/18 2015 (!) 126/91 - - 76 - 96 % - -  10/25/18 1945 (!) 134/94 - - 82 - 100 % - -  10/25/18 1930 120/89 - - 80 - 100 % - -  10/25/18 1915 (!) 122/93 - - 74 - 100 % - -  10/25/18 1900 (!) 119/93 - - 69 - 100 % - -  10/25/18 1836 - - - - - - 6' (1.829 m) 109 kg  10/25/18 1834 98/78 97.6 F (36.4 C) Oral 63 18 99 % - -  .  Recent laboratory studies: Dg Hand Complete Right  Result Date: 10/25/2018 CLINICAL DATA:  Injury to right thumb this afternoon. EXAM: RIGHT HAND - COMPLETE 3+ VIEW COMPARISON:  None. FINDINGS: Examination demonstrates a comminuted displaced fracture involving the distal aspect of the first proximal phalanx and first distal phalanx. Moderate associated soft tissue injury. Overlying bandage is present. Remainder the exam is unremarkable. IMPRESSION: Displaced comminuted fracture  involving the distal aspect of the first proximal phalanx and adjacent first distal phalanx. Electronically Signed   By: Marin Olp M.D.   On: 10/25/2018 20:13    Discharge Medications:   Allergies as of 10/26/2018   No Known Allergies     Medication List    TAKE these medications   cephALEXin 500 MG capsule Commonly known as:  KEFLEX Take 1 capsule (500 mg total) by mouth 4 (four) times daily for 10 days.   oxyCODONE-acetaminophen 5-325 MG tablet Commonly known as:  PERCOCET/ROXICET Take 1 tablet by mouth every 6 (six) hours as needed for up to 5 days for severe pain.       Diagnostic Studies: Dg Hand Complete Right  Result Date: 10/25/2018 CLINICAL DATA:  Injury to right thumb this afternoon. EXAM: RIGHT HAND - COMPLETE 3+ VIEW COMPARISON:  None. FINDINGS:  Examination demonstrates a comminuted displaced fracture involving the distal aspect of the first proximal phalanx and first distal phalanx. Moderate associated soft tissue injury. Overlying bandage is present. Remainder the exam is unremarkable. IMPRESSION: Displaced comminuted fracture involving the distal aspect of the first proximal phalanx and adjacent first distal phalanx. Electronically Signed   By: Marin Olp M.D.   On: 10/25/2018 20:13    They benefited maximally from their hospital stay and there were no complications.     Disposition: Discharge disposition: 01-Home or Self Care      Discharge Instructions    Discharge patient   Complete by:  As directed    Discharge disposition:  01-Home or Self Care   Discharge patient date:  10/26/2018     Follow-up Information    Iran Planas, MD In 8 days.   Specialty:  Orthopedic Surgery Contact information: [address] [place] [address] [phone]            Signed: Brynda Peon 10/26/2018, 3:25 PM

## 2018-10-26 NOTE — Anesthesia Preprocedure Evaluation (Signed)
Anesthesia Evaluation  Patient identified by MRN, date of birth, ID band Patient awake    Reviewed: Allergy & Precautions, H&P , NPO status , Patient's Chart, lab work & pertinent test results  Airway Mallampati: II  TM Distance: >3 FB Neck ROM: Full    Dental no notable dental hx. (+) Teeth Intact, Dental Advisory Given   Pulmonary neg pulmonary ROS,    Pulmonary exam normal breath sounds clear to auscultation       Cardiovascular negative cardio ROS   Rhythm:Regular Rate:Normal     Neuro/Psych negative neurological ROS  negative psych ROS   GI/Hepatic negative GI ROS, Neg liver ROS,   Endo/Other  negative endocrine ROS  Renal/GU negative Renal ROS  negative genitourinary   Musculoskeletal   Abdominal   Peds  Hematology negative hematology ROS (+)   Anesthesia Other Findings   Reproductive/Obstetrics negative OB ROS                            Anesthesia Physical Anesthesia Plan  ASA: II  Anesthesia Plan: General   Post-op Pain Management:    Induction: Intravenous  PONV Risk Score and Plan: 3 and Ondansetron, Dexamethasone and Midazolam  Airway Management Planned: LMA  Additional Equipment:   Intra-op Plan:   Post-operative Plan: Extubation in OR  Informed Consent: I have reviewed the patients History and Physical, chart, labs and discussed the procedure including the risks, benefits and alternatives for the proposed anesthesia with the patient or authorized representative who has indicated his/her understanding and acceptance.     Dental advisory given  Plan Discussed with: CRNA  Anesthesia Plan Comments:         Anesthesia Quick Evaluation  

## 2018-10-27 ENCOUNTER — Encounter (HOSPITAL_COMMUNITY): Payer: Self-pay | Admitting: Orthopedic Surgery

## 2018-12-24 ENCOUNTER — Encounter: Payer: BLUE CROSS/BLUE SHIELD | Admitting: Family Medicine

## 2020-06-12 IMAGING — DX RIGHT HAND - COMPLETE 3+ VIEW
3 series · 3 of 3 positions shown · non-contrast
Comparison: None.

CLINICAL DATA: Injury to right thumb this afternoon.

EXAM:
RIGHT HAND - COMPLETE 3+ VIEW

[hand pa]
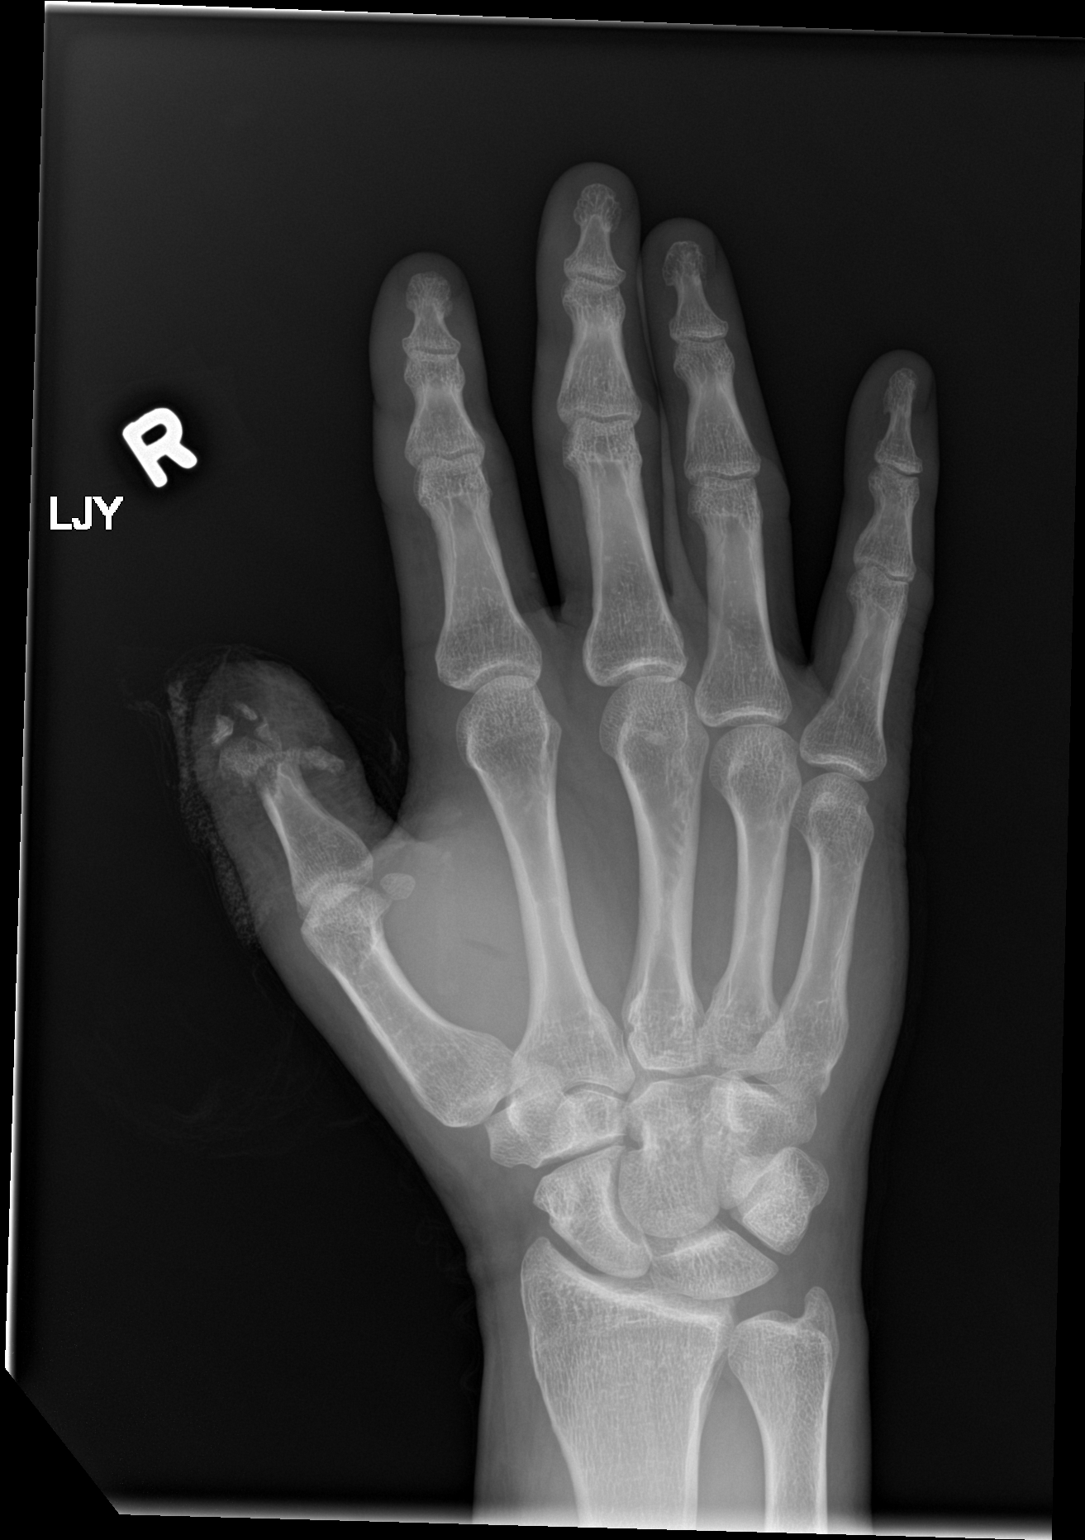

[hand obl]
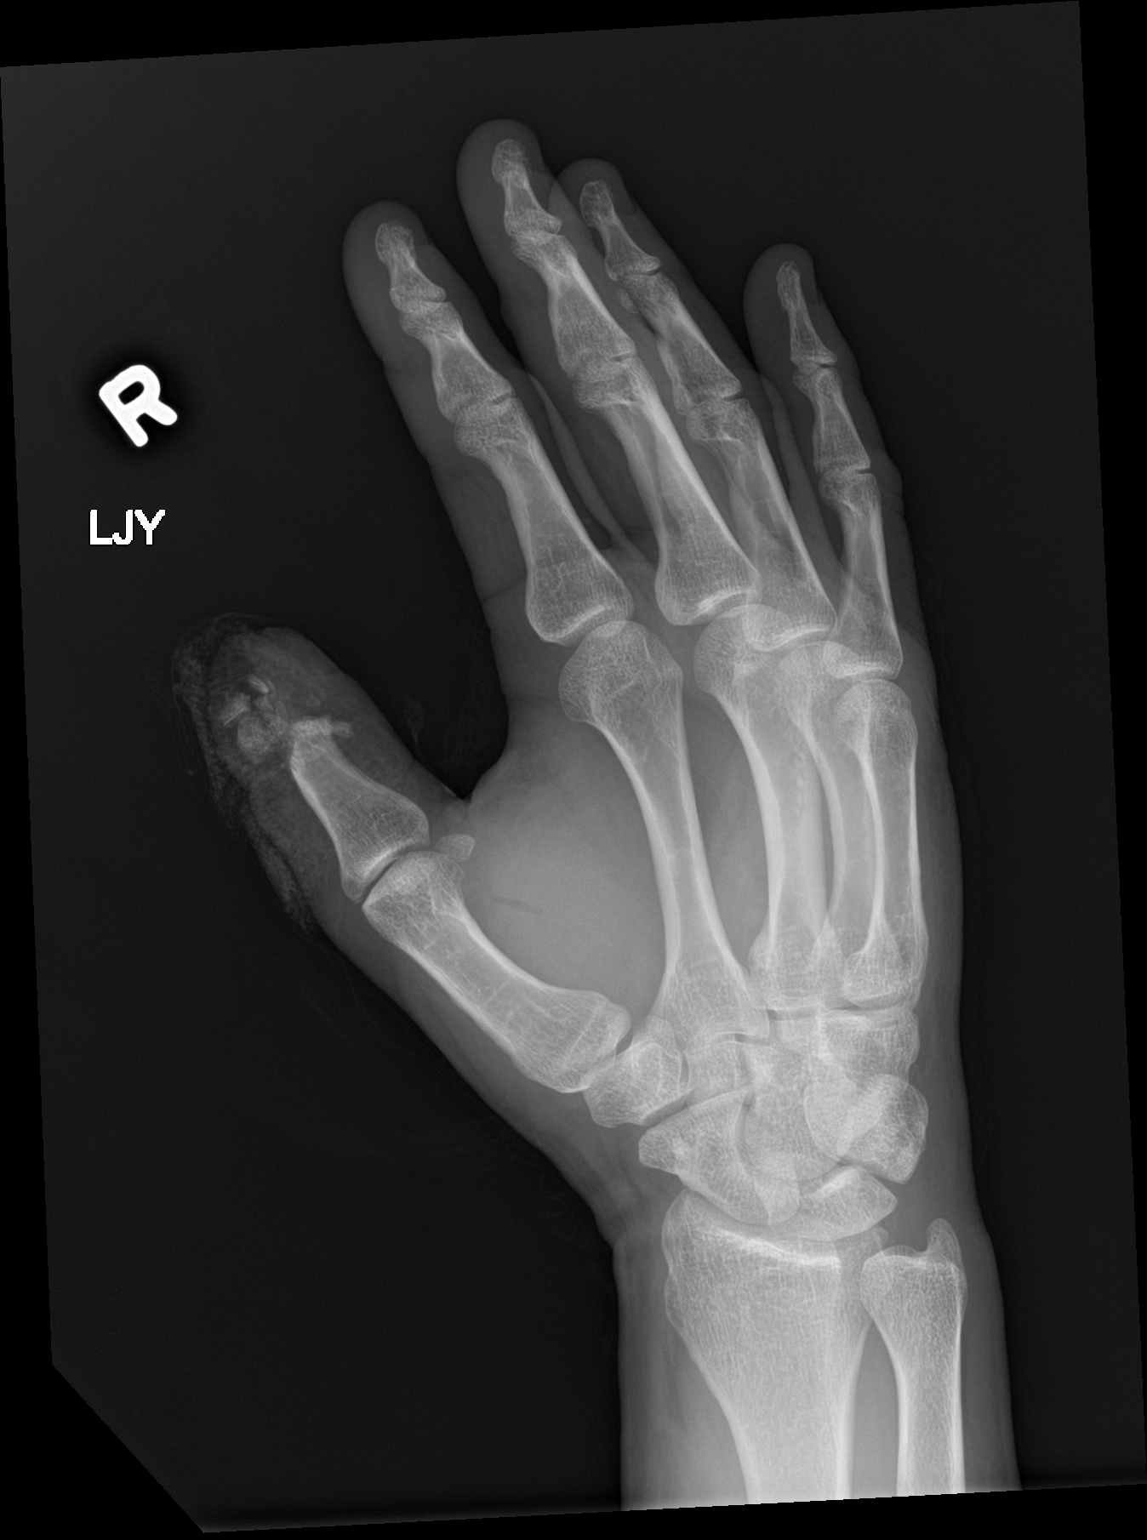

[hand lat]
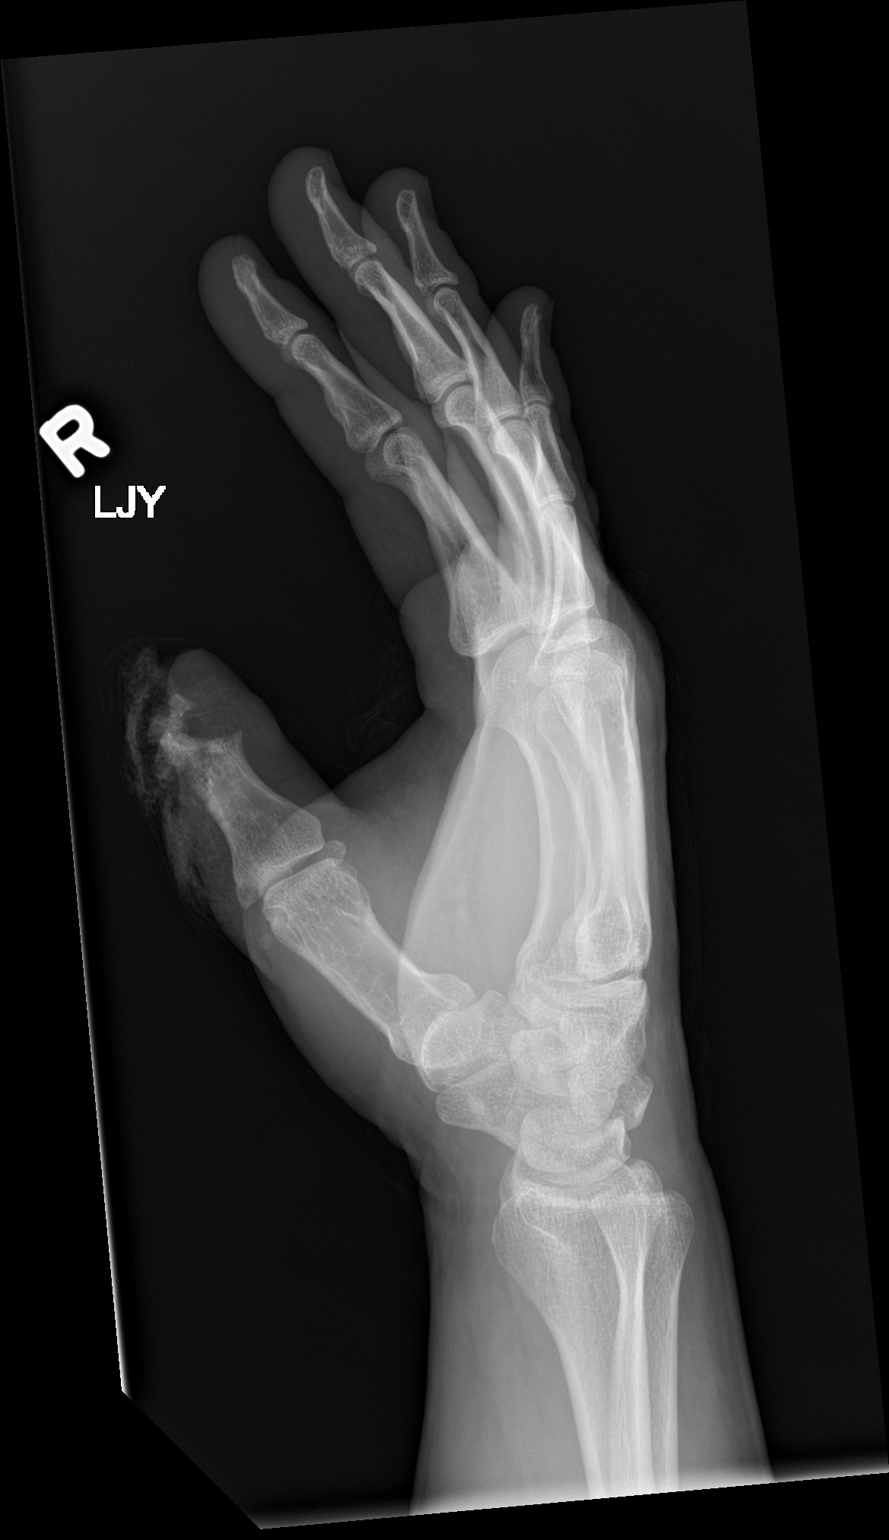

[3 of 3 positions shown; findings below may reference images not displayed]

FINDINGS: Examination demonstrates a comminuted displaced fracture involving
the distal aspect of the first proximal phalanx and first distal
phalanx. Moderate associated soft tissue injury. Overlying bandage
is present. Remainder the exam is unremarkable.
IMPRESSION: Displaced comminuted fracture involving the distal aspect of the
first proximal phalanx and adjacent first distal phalanx.
